# Patient Record
Sex: Female | Born: 1960 | Race: Black or African American | Hispanic: No | Smoking: Former smoker
Health system: Southern US, Community
[De-identification: ages and names within clinical notes are randomized; demographics above are authoritative.]

## PROBLEM LIST (undated history)

## (undated) DIAGNOSIS — E669 Obesity, unspecified: Secondary | ICD-10-CM

## (undated) DIAGNOSIS — E785 Hyperlipidemia, unspecified: Secondary | ICD-10-CM

## (undated) DIAGNOSIS — Z860100 Personal history of colon polyps, unspecified: Secondary | ICD-10-CM

## (undated) DIAGNOSIS — E119 Type 2 diabetes mellitus without complications: Secondary | ICD-10-CM

## (undated) DIAGNOSIS — I10 Essential (primary) hypertension: Secondary | ICD-10-CM

## (undated) DIAGNOSIS — D649 Anemia, unspecified: Secondary | ICD-10-CM

## (undated) DIAGNOSIS — C3490 Malignant neoplasm of unspecified part of unspecified bronchus or lung: Secondary | ICD-10-CM

## (undated) DIAGNOSIS — M1990 Unspecified osteoarthritis, unspecified site: Secondary | ICD-10-CM

## (undated) HISTORY — DX: Personal history of colonic polyps: Z86.010

## (undated) HISTORY — DX: Personal history of colon polyps, unspecified: Z86.0100

## (undated) HISTORY — DX: Obesity, unspecified: E66.9

## (undated) HISTORY — DX: Anemia, unspecified: D64.9

## (undated) HISTORY — DX: Unspecified osteoarthritis, unspecified site: M19.90

## (undated) HISTORY — PX: NO PAST SURGERIES: SHX2092

---

## 2010-12-14 ENCOUNTER — Emergency Department (HOSPITAL_COMMUNITY)
Admission: EM | Admit: 2010-12-14 | Discharge: 2010-12-15 | Disposition: A | Discharge status: Home / Self Care | Payer: Medicaid Other | Attending: Emergency Medicine | Admitting: Emergency Medicine

## 2010-12-14 DIAGNOSIS — R109 Unspecified abdominal pain: Secondary | ICD-10-CM | POA: Insufficient documentation

## 2010-12-14 DIAGNOSIS — I10 Essential (primary) hypertension: Secondary | ICD-10-CM | POA: Insufficient documentation

## 2010-12-15 ENCOUNTER — Emergency Department (HOSPITAL_COMMUNITY): Payer: Medicaid Other

## 2010-12-15 ENCOUNTER — Other Ambulatory Visit (HOSPITAL_COMMUNITY): Payer: Self-pay

## 2010-12-15 ENCOUNTER — Encounter (HOSPITAL_COMMUNITY): Payer: Self-pay

## 2010-12-15 LAB — URINALYSIS, ROUTINE W REFLEX MICROSCOPIC
Glucose, UA: NEGATIVE mg/dL
Ketones, ur: NEGATIVE mg/dL
Leukocytes, UA: NEGATIVE
pH: 6 (ref 5.0–8.0)

## 2010-12-15 LAB — COMPREHENSIVE METABOLIC PANEL
ALT: 16 U/L (ref 0–35)
AST: 22 U/L (ref 0–37)
Albumin: 4 g/dL (ref 3.5–5.2)
Alkaline Phosphatase: 65 U/L (ref 39–117)
Chloride: 97 mEq/L (ref 96–112)
Creatinine, Ser: 0.73 mg/dL (ref 0.50–1.10)
Potassium: 4.2 mEq/L (ref 3.5–5.1)
Sodium: 133 mEq/L — ABNORMAL LOW (ref 135–145)
Total Bilirubin: 0.3 mg/dL (ref 0.3–1.2)

## 2010-12-15 LAB — DIFFERENTIAL
Basophils Absolute: 0 10*3/uL (ref 0.0–0.1)
Basophils Relative: 1 % (ref 0–1)
Eosinophils Absolute: 0.3 10*3/uL (ref 0.0–0.7)
Eosinophils Relative: 5 % (ref 0–5)
Neutrophils Relative %: 45 % (ref 43–77)

## 2010-12-15 LAB — CBC
Platelets: 251 10*3/uL (ref 150–400)
RDW: 15.3 % (ref 11.5–15.5)
WBC: 5.7 10*3/uL (ref 4.0–10.5)

## 2010-12-16 LAB — URINE CULTURE
Colony Count: NO GROWTH
Culture  Setup Time: 201209170952

## 2012-04-22 DIAGNOSIS — M5416 Radiculopathy, lumbar region: Secondary | ICD-10-CM | POA: Insufficient documentation

## 2012-04-22 DIAGNOSIS — M5430 Sciatica, unspecified side: Secondary | ICD-10-CM | POA: Insufficient documentation

## 2012-04-26 DIAGNOSIS — E119 Type 2 diabetes mellitus without complications: Secondary | ICD-10-CM | POA: Insufficient documentation

## 2012-07-07 HISTORY — PX: COLONOSCOPY: SHX174

## 2012-07-07 LAB — HM COLONOSCOPY

## 2013-01-23 DIAGNOSIS — Z720 Tobacco use: Secondary | ICD-10-CM | POA: Insufficient documentation

## 2015-09-04 DIAGNOSIS — N951 Menopausal and female climacteric states: Secondary | ICD-10-CM | POA: Insufficient documentation

## 2015-09-04 DIAGNOSIS — J309 Allergic rhinitis, unspecified: Secondary | ICD-10-CM | POA: Insufficient documentation

## 2015-09-04 DIAGNOSIS — Z6839 Body mass index (BMI) 39.0-39.9, adult: Secondary | ICD-10-CM | POA: Insufficient documentation

## 2015-09-04 DIAGNOSIS — E1159 Type 2 diabetes mellitus with other circulatory complications: Secondary | ICD-10-CM | POA: Insufficient documentation

## 2015-09-04 DIAGNOSIS — I10 Essential (primary) hypertension: Secondary | ICD-10-CM | POA: Insufficient documentation

## 2015-09-04 DIAGNOSIS — I152 Hypertension secondary to endocrine disorders: Secondary | ICD-10-CM | POA: Insufficient documentation

## 2015-09-04 DIAGNOSIS — E785 Hyperlipidemia, unspecified: Secondary | ICD-10-CM | POA: Insufficient documentation

## 2016-09-03 DIAGNOSIS — F17210 Nicotine dependence, cigarettes, uncomplicated: Secondary | ICD-10-CM | POA: Insufficient documentation

## 2018-01-03 ENCOUNTER — Emergency Department
Admission: EM | Admit: 2018-01-03 | Discharge: 2018-01-03 | Disposition: A | Discharge status: Home / Self Care | Payer: Self-pay | Source: Home / Self Care | Attending: Family Medicine | Admitting: Family Medicine

## 2018-01-03 ENCOUNTER — Other Ambulatory Visit: Payer: Self-pay

## 2018-01-03 ENCOUNTER — Encounter: Payer: Self-pay | Admitting: *Deleted

## 2018-01-03 ENCOUNTER — Emergency Department (INDEPENDENT_AMBULATORY_CARE_PROVIDER_SITE_OTHER): Payer: Self-pay

## 2018-01-03 DIAGNOSIS — S46911A Strain of unspecified muscle, fascia and tendon at shoulder and upper arm level, right arm, initial encounter: Secondary | ICD-10-CM

## 2018-01-03 DIAGNOSIS — M25511 Pain in right shoulder: Secondary | ICD-10-CM

## 2018-01-03 HISTORY — DX: Hyperlipidemia, unspecified: E78.5

## 2018-01-03 HISTORY — DX: Essential (primary) hypertension: I10

## 2018-01-03 HISTORY — DX: Type 2 diabetes mellitus without complications: E11.9

## 2018-01-03 MED ORDER — HYDROCODONE-ACETAMINOPHEN 5-325 MG PO TABS: ORAL_TABLET | ORAL | 0 refills | Status: DC
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
## 2018-01-03 NOTE — Discharge Instructions (Addendum)
Apply ice pack for 20 to 30 minutes, 3 to 4 times daily  Continue until pain and swelling decrease.  May take Ibuprofen 200mg, 4 tabs every 8 hours with food.  
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
## 2018-01-04 ENCOUNTER — Encounter: Payer: Self-pay | Admitting: Family Medicine

## 2018-01-04 ENCOUNTER — Telehealth: Payer: Self-pay

## 2018-01-04 ENCOUNTER — Ambulatory Visit (INDEPENDENT_AMBULATORY_CARE_PROVIDER_SITE_OTHER): Payer: Self-pay | Admitting: Family Medicine

## 2018-01-04 VITALS — BP 170/80 | HR 72 | Ht 68.0 in | Wt 259.0 lb

## 2018-01-04 DIAGNOSIS — S46011A Strain of muscle(s) and tendon(s) of the rotator cuff of right shoulder, initial encounter: Secondary | ICD-10-CM

## 2018-01-04 MED ORDER — DICLOFENAC SODIUM 1 % TD GEL: 2.0000 g | Freq: Four times a day (QID) | TRANSDERMAL | 11 refills | Status: DC

## 2018-01-04 NOTE — Patient Instructions (Addendum)
Thank you for coming in today. Do the range of motion exercises.  Start the band exercises when able.  Attend PT if not improving.  We can do a shot if needed.  Apply the diclofenac gel up to 4x daily for pain as needed.   Recheck in 4-6 weeks.   Return sooner if needed.    Shoulder Impingement Syndrome Rehab Ask your health care provider which exercises are safe for you. Do exercises exactly as told by your health care provider and adjust them as directed. It is normal to feel mild stretching, pulling, tightness, or discomfort as you do these exercises, but you should stop right away if you feel sudden pain or your pain gets worse.Do not begin these exercises until told by your health care provider. Stretching and range of motion exercise This exercise warms up your muscles and joints and improves the movement and flexibility of your shoulder. This exercise also helps to relieve pain and stiffness. Exercise A: Passive horizontal adduction  1. Sit or stand and pull your left / right elbow across your chest, toward your other shoulder. Stop when you feel a gentle stretch in the back of your shoulder and upper arm. ? Keep your arm at shoulder height. ? Keep your arm as close to your body as you comfortably can. 2. Hold for __________ seconds. 3. Slowly return to the starting position. Repeat __________ times. Complete this exercise __________ times a day. Strengthening exercises These exercises build strength and endurance in your shoulder. Endurance is the ability to use your muscles for a long time, even after they get tired. Exercise B: External rotation, isometric 1. Stand or sit in a doorway, facing the door frame. 2. Bend your left / right elbow and place the back of your wrist against the door frame. Only your wrist should be touching the frame. Keep your upper arm at your side. 3. Gently press your wrist against the door frame, as if you are trying to push your arm away from your  abdomen. ? Avoid shrugging your shoulder while you press your hand against the door frame. Keep your shoulder blade tucked down toward the middle of your back. 4. Hold for __________ seconds. 5. Slowly release the tension, and relax your muscles completely before you do the exercise again. Repeat __________ times. Complete this exercise __________ times a day. Exercise C: Internal rotation, isometric  1. Stand or sit in a doorway, facing the door frame. 2. Bend your left / right elbow and place the inside of your wrist against the door frame. Only your wrist should be touching the frame. Keep your upper arm at your side. 3. Gently press your wrist against the door frame, as if you are trying to push your arm toward your abdomen. ? Avoid shrugging your shoulder while you press your hand against the door frame. Keep your shoulder blade tucked down toward the middle of your back. 4. Hold for __________ seconds. 5. Slowly release the tension, and relax your muscles completely before you do the exercise again. Repeat __________ times. Complete this exercise __________ times a day. Exercise D: Scapular protraction, supine  1. Lie on your back on a firm surface. Hold a __________ weight in your left / right hand. 2. Raise your left / right arm straight into the air so your hand is directly above your shoulder joint. 3. Push the weight into the air so your shoulder lifts off of the surface that you are lying on. Do not  move your head, neck, or back. 4. Hold for __________ seconds. 5. Slowly return to the starting position. Let your muscles relax completely before you repeat this exercise. Repeat __________ times. Complete this exercise __________ times a day. Exercise E: Scapular retraction  1. Sit in a stable chair without armrests, or stand. 2. Secure an exercise band to a stable object in front of you so the band is at shoulder height. 3. Hold one end of the exercise band in each hand. Your  palms should face down. 4. Squeeze your shoulder blades together and move your elbows slightly behind you. Do not shrug your shoulders while you do this. 5. Hold for __________ seconds. 6. Slowly return to the starting position. Repeat __________ times. Complete this exercise __________ times a day. Exercise F: Shoulder extension  1. Sit in a stable chair without armrests, or stand. 2. Secure an exercise band to a stable object in front of you where the band is above shoulder height. 3. Hold one end of the exercise band in each hand. 4. Straighten your elbows and lift your hands up to shoulder height. 5. Squeeze your shoulder blades together and pull your hands down to the sides of your thighs. Stop when your hands are straight down by your sides. Do not let your hands go behind your body. 6. Hold for __________ seconds. 7. Slowly return to the starting position. Repeat __________ times. Complete this exercise __________ times a day. This information is not intended to replace advice given to you by your health care provider. Make sure you discuss any questions you have with your health care provider. Document Released: 03/16/2005 Document Revised: 11/21/2015 Document Reviewed: 02/16/2015 Elsevier Interactive Patient Education  [employer].

## 2018-01-04 NOTE — Telephone Encounter (Signed)
Patient called stated that she needs a release letter saying that she can go back to work. Patient stated that she has plans to go back tonight. Patient wants letter faxed to [facility]. Benjamine Mola @ [phone]. Rhonda Cunningham,CMA
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
## 2018-01-04 NOTE — Telephone Encounter (Signed)
Letter has been faxed and a message was left on patient vm advising of this. Gerald Kuehl,CMA

## 2018-01-04 NOTE — Telephone Encounter (Signed)
Letter written and will be faxed today.

## 2018-02-02 ENCOUNTER — Encounter: Payer: Self-pay | Admitting: Physician Assistant

## 2018-02-02 ENCOUNTER — Ambulatory Visit (INDEPENDENT_AMBULATORY_CARE_PROVIDER_SITE_OTHER): Payer: Self-pay | Admitting: Physician Assistant

## 2018-02-02 VITALS — BP 124/78 | HR 70 | Resp 14 | Wt 258.0 lb

## 2018-02-02 DIAGNOSIS — Z860100 Personal history of colon polyps, unspecified: Secondary | ICD-10-CM

## 2018-02-02 DIAGNOSIS — Z780 Asymptomatic menopausal state: Secondary | ICD-10-CM | POA: Insufficient documentation

## 2018-02-02 DIAGNOSIS — Z1231 Encounter for screening mammogram for malignant neoplasm of breast: Secondary | ICD-10-CM

## 2018-02-02 DIAGNOSIS — E1159 Type 2 diabetes mellitus with other circulatory complications: Secondary | ICD-10-CM

## 2018-02-02 DIAGNOSIS — F17210 Nicotine dependence, cigarettes, uncomplicated: Secondary | ICD-10-CM

## 2018-02-02 DIAGNOSIS — Z7689 Persons encountering health services in other specified circumstances: Secondary | ICD-10-CM

## 2018-02-02 DIAGNOSIS — E119 Type 2 diabetes mellitus without complications: Secondary | ICD-10-CM

## 2018-02-02 DIAGNOSIS — N907 Vulvar cyst: Secondary | ICD-10-CM

## 2018-02-02 DIAGNOSIS — I10 Essential (primary) hypertension: Secondary | ICD-10-CM

## 2018-02-02 LAB — POCT GLYCOSYLATED HEMOGLOBIN (HGB A1C): HbA1c, POC (prediabetic range): 6.7 % — AB (ref 5.7–6.4)

## 2018-02-02 MED ORDER — GLIPIZIDE ER 10 MG PO TB24: 10.0000 mg | ORAL_TABLET | Freq: Every day | ORAL | Status: DC

## 2018-02-02 MED ORDER — NICOTINE 14 MG/24HR TD PT24: 14.0000 mg | MEDICATED_PATCH | Freq: Every day | TRANSDERMAL | 5 refills | Status: DC

## 2018-02-02 MED ORDER — AMLODIPINE BESYLATE 10 MG PO TABS: 10.0000 mg | ORAL_TABLET | ORAL | Status: DC

## 2018-02-02 MED ORDER — METFORMIN HCL ER 500 MG PO TB24: 1000.0000 mg | ORAL_TABLET | Freq: Every day | ORAL | Status: DC

## 2018-02-02 MED ORDER — LISINOPRIL-HYDROCHLOROTHIAZIDE 20-25 MG PO TABS: 1.0000 | ORAL_TABLET | ORAL | Status: DC

## 2018-02-02 NOTE — Patient Instructions (Addendum)
Diabetes Preventive Care: - annual foot exam  - annual dilated eye exam with an eye doctor - self foot exams at least weekly - pneumonia vaccine once (booster in 5 years and at age 57) - annual influenza vaccine - twice yearly dental cleanings and yearly exam - goal blood pressure <140/90, ideally <130/80 - LDL cholesterol <70 - A1C <7.0 - body mass index (BMI) <25.0 - follow-up every 3 months if your A1C is not at goal - follow-up every 6 months if diabetes is well controlled   Coping with Quitting Smoking Quitting smoking is a physical and mental challenge. You will face cravings, withdrawal symptoms, and temptation. Before quitting, work with your health care provider to make a plan that can help you cope. Preparation can help you quit and keep you from giving in. How can I cope with cravings? Cravings usually last for 5-10 minutes. If you get through it, the craving will pass. Consider taking the following actions to help you cope with cravings:  Keep your mouth busy: ? Chew sugar-free gum. ? Suck on hard candies or a straw. ? Brush your teeth.  Keep your hands and body busy: ? Immediately change to a different activity when you feel a craving. ? Squeeze or play with a ball. ? Do an activity or a hobby, like making bead jewelry, practicing needlepoint, or working with wood. ? Mix up your normal routine. ? Take a short exercise break. Go for a quick walk or run up and down stairs. ? Spend time in public places where smoking is not allowed.  Focus on doing something kind or helpful for someone else.  Call a friend or family member to talk during a craving.  Join a support group.  Call a quit line, such as 1-800-QUIT-NOW.  Talk with your health care provider about medicines that might help you cope with cravings and make quitting easier for you.  How can I deal with withdrawal symptoms? Your body may experience negative effects as it tries to get used to not having  nicotine in the system. These effects are called withdrawal symptoms. They may include:  Feeling hungrier than normal.  Trouble concentrating.  Irritability.  Trouble sleeping.  Feeling depressed.  Restlessness and agitation.  Craving a cigarette.  To manage withdrawal symptoms:  Avoid places, people, and activities that trigger your cravings.  Remember why you want to quit.  Get plenty of sleep.  Avoid coffee and other caffeinated drinks. These may worsen some of your symptoms.  How can I handle social situations? Social situations can be difficult when you are quitting smoking, especially in the first few weeks. To manage this, you can:  Avoid parties, bars, and other social situations where people might be smoking.  Avoid alcohol.  Leave right away if you have the urge to smoke.  Explain to your family and friends that you are quitting smoking. Ask for understanding and support.  Plan activities with friends or family where smoking is not an option.  What are some ways I can cope with stress? Wanting to smoke may cause stress, and stress can make you want to smoke. Find ways to manage your stress. Relaxation techniques can help. For example:  Breathe slowly and deeply, in through your nose and out through your mouth.  Listen to soothing, relaxing music.  Talk with a family member or friend about your stress.  Light a candle.  Soak in a bath or take a shower.  Think about a peaceful  place.  What are some ways I can prevent weight gain? Be aware that many people gain weight after they quit smoking. However, not everyone does. To keep from gaining weight, have a plan in place before you quit and stick to the plan after you quit. Your plan should include:  Having healthy snacks. When you have a craving, it may help to: ? Eat plain popcorn, crunchy carrots, celery, or other cut vegetables. ? Chew sugar-free gum.  Changing how you eat: ? Eat small portion  sizes at meals. ? Eat 4-6 small meals throughout the day instead of 1-2 large meals a day. ? Be mindful when you eat. Do not watch television or do other things that might distract you as you eat.  Exercising regularly: ? Make time to exercise each day. If you do not have time for a long workout, do short bouts of exercise for 5-10 minutes several times a day. ? Do some form of strengthening exercise, like weight lifting, and some form of aerobic exercise, like running or swimming.  Drinking plenty of water or other low-calorie or no-calorie drinks. Drink 6-8 glasses of water daily, or as much as instructed by your health care provider.  Summary  Quitting smoking is a physical and mental challenge. You will face cravings, withdrawal symptoms, and temptation to smoke again. Preparation can help you as you go through these challenges.  You can cope with cravings by keeping your mouth busy (such as by chewing gum), keeping your body and hands busy, and making calls to family, friends, or a helpline for people who want to quit smoking.  You can cope with withdrawal symptoms by avoiding places where people smoke, avoiding drinks with caffeine, and getting plenty of rest.  Ask your health care provider about the different ways to prevent weight gain, avoid stress, and handle social situations. This information is not intended to replace advice given to you by your health care provider. Make sure you discuss any questions you have with your health care provider. Document Released: 03/13/2016 Document Revised: 03/13/2016 Document Reviewed: 03/13/2016 Elsevier Interactive Patient Education  [employer].

## 2018-02-02 NOTE — Progress Notes (Signed)
HPI:                                                                Morgan Garza is a 57 y.o. female who presents to [facility] today to establish care  Current concerns:  DMII: taking Metformin XR and Glipizide XL 10 mg daily. Reports she is not always compliant with medications.  Denies polydipsia, polyuria, polyphagia. Denies blurred vision or vision change. Denies extremity pain, altered sensation and paresthesias.  Denies ulcers/wounds on feet.  HTN: taking Prinzide 20-25 mg daily and Amlodipine 10 mg. Compliant with medications. Does not hecks BP's at home. Denies chest pain with exertion, orthopnea, lightheadedness, syncope and edema. Risk factors include: DM2, obesity, tobacco use,   LMP was over 1 year ago and prior to that only had 1 period in 2017. States she has been abstinent since 2008. Reports very remote history of chlamydia at age 37. She states she has a romantic interest who lives in [place] and she wants to make sure she can't get pregnant. Reports "skin tags" on her vulva that have been present for approx 6 months. They are not symptomatic, but she just wants to make sure it is okay to be sexually active again.  She is also interested in smoking cessation. She reports Wellbutrin was effective for her in past quit attempts, but unfortunately she developed a rash with later use. She also had intolerance to Chantix.  Depression screen PHQ 2/9 02/02/2018  Decreased Interest 0  Down, Depressed, Hopeless 0  PHQ - 2 Score 0  Altered sleeping 0  Tired, decreased energy 3  Change in appetite 3  Feeling bad or failure about yourself  0  Trouble concentrating 0  Moving slowly or fidgety/restless 3  Suicidal thoughts 0  PHQ-9 Score 9    GAD 7 : Generalized Anxiety Score 02/02/2018  Nervous, Anxious, on Edge 3  Control/stop worrying 3  Worry too much - different things 3  Trouble relaxing 3  Restless 3  Easily annoyed  or irritable 0  Afraid - awful might happen 0  Total GAD 7 Score 15      Past Medical History:  Diagnosis Date  . Diabetes mellitus without complication ([facility])   . History of colon polyps   . Hyperlipidemia   . Hypertension    Past Surgical History:  Procedure Laterality Date  . NO PAST SURGERIES     Social History   Tobacco Use  . Smoking status: Current Every Day Smoker    Packs/day: 0.50    Years: 1.00    Pack years: 0.50    Types: Cigarettes  . Smokeless tobacco: Never Used  Substance Use Topics  . Alcohol use: Not Currently    Frequency: Never   family history includes COPD in her mother; Cancer in her father; Gastric cancer in her father; Hypertension in her mother; Testicular cancer in her son.    ROS: negative except as noted in the HPI  Medications: Current Outpatient Medications  Medication Sig Dispense Refill  . amLODipine (NORVASC) 10 MG tablet Take 1 tablet (10 mg total) by mouth every morning.    Marland Kitchen atorvastatin (LIPITOR) 20 MG tablet Take by mouth.    . Cholecalciferol (VITAMIN D3) 5000  units TABS Take by mouth.    Marland Kitchen glipiZIDE (GLUCOTROL XL) 10 MG 24 hr tablet Take 1 tablet (10 mg total) by mouth daily before breakfast.    . glucose blood (PRECISION QID TEST) test strip Use one strip to test blood sugar.    . HYDROcodone-acetaminophen (NORCO/VICODIN) 5-325 MG tablet Take 1 tablet by mouth every 8 (eight) hours as needed for moderate pain. 15 tablet 0  . ibuprofen (ADVIL,MOTRIN) 800 MG tablet Take by mouth.    Marland Kitchen lisinopril-hydrochlorothiazide (PRINZIDE,ZESTORETIC) 20-25 MG tablet Take 1 tablet by mouth every morning.    . metFORMIN (GLUCOPHAGE-XR) 500 MG 24 hr tablet Take 2 tablets (1,000 mg total) by mouth daily with supper.    . nicotine (NICODERM CQ) 14 mg/24hr patch Place 1 patch (14 mg total) onto the skin daily. 28 patch 5   No current facility-administered medications for this visit.    Allergies  Allergen Reactions  . Bupropion Hives        . Varenicline Other (See Comments)    Vision changes, "everything was enhanced"       Objective:  BP 124/78   Pulse 70   Resp 14   Wt 258 lb (117 kg)   LMP 11/09/2010   BMI 39.23 kg/m  Gen:  alert, not ill-appearing, no distress, appropriate for age, obese female HEENT: head normocephalic without obvious abnormality, conjunctiva and cornea clear, trachea midline Pulm: Normal work of breathing, normal phonation, clear to auscultation bilaterally, no wheezes, rales or rhonchi CV: Normal rate, regular rhythm, s1 and s2 distinct, no murmurs, clicks or rubs  Neuro: alert and oriented x 3, no tremor MSK: extremities atraumatic, normal gait and station Skin: intact, proximal left upper extremity there is a large approx 3-4 cm pedunculated skin tag GU: bilateral labia majora there are several small approx 5 mm smooth, cystic nodules without tenderness or drainage Psych: well-groomed, cooperative, good eye contact, euthymic mood, affect mood-congruent, speech is articulate, and thought processes clear and goal-directed    A chaperone was used for the GU portion of the exam, Emeterio Reeve, DO  Lab Results  Component Value Date   HGBA1C 6.7 (A) 02/02/2018   Lab Results  Component Value Date   CREATININE 0.73 12/15/2010   BUN 12 12/15/2010   NA 133 (L) 12/15/2010   K 4.2 12/15/2010   CL 97 12/15/2010   CO2 26 12/15/2010     No results found for this or any previous visit (from the past 72 hour(s)). No results found.    Assessment and Plan: 57 y.o. female with   .Morgan Garza was seen today for establish care.  Diagnoses and all orders for this visit:  Encounter to establish care  Type 2 diabetes mellitus without complication, without long-term current use of insulin (HCC) -     COMPLETE METABOLIC PANEL WITH GFR -     metFORMIN (GLUCOPHAGE-XR) 500 MG 24 hr tablet; Take 2 tablets (1,000 mg total) by mouth daily with supper. -     glipiZIDE (GLUCOTROL XL) 10 MG 24 hr  tablet; Take 1 tablet (10 mg total) by mouth daily before breakfast. -     POCT HgB A1C  Hypertension associated with diabetes (HCC) -     lisinopril-hydrochlorothiazide (PRINZIDE,ZESTORETIC) 20-25 MG tablet; Take 1 tablet by mouth every morning. -     amLODipine (NORVASC) 10 MG tablet; Take 1 tablet (10 mg total) by mouth every morning.  Epidermoid cyst of labia majora  History of colon polyps  Postmenopausal  Cigarette nicotine dependence without complication -     nicotine (NICODERM CQ) 14 mg/24hr patch; Place 1 patch (14 mg total) onto the skin daily.  Breast cancer screening by mammogram -     MM 3D SCREEN BREAST BILATERAL; Future     - Personally reviewed PMH, PSH, PFH, medications, allergies, HM - Age-appropriate cancer screening: mammogram ordered, overdue for Pap smear, overdue for colonoscopy; she is self-pay and cannot currently afford all of her preventive measures - Immunizations reviewed and UTD - PHQ2 negative  Diabetes   A1c at goal. Since compliance has been an issue, will reduce her Metformin to 1000 mg with evening meal. Cont Glipizide XL 10 mg daily. She was amenable to these changes She is overdue for routine labs. She is self-pay so will order BMP only for now to monitor renal function LDL goal <70, cont statin BP goal <140/90, cont ACE-thiazide Counseled on diabetes preventive care  Labial cysts Reassurance of benign nature of these skin lesions If she desires removal, can refer to Gynecology She declines referral at this time  HTN BP at goal She will need to complete renal function for refills of her antihypertensive medication. She expressed understanding  Nicotine dependence The patient was counseled on the dangers of tobacco use, and was advised to quit.  Reviewed strategies to maximize success, including removing cigarettes and smoking materials from environment, stress management, support of family/friends, written materials and pharmacotherapy  (Nicoderm CQ). Plan to use Nicotine replacement. Start with 14 mg patch for 4-6 weeks, then reduce to 7 mg patch for 2-4 weeks   Patient education and anticipatory guidance given Patient agrees with treatment plan Follow-up in 3 months for medication management or sooner as needed if symptoms worsen or fail to improve  Darlyne Russian PA-C

## 2018-02-04 ENCOUNTER — Ambulatory Visit (INDEPENDENT_AMBULATORY_CARE_PROVIDER_SITE_OTHER): Payer: Self-pay | Admitting: Sports Medicine

## 2018-02-04 ENCOUNTER — Encounter: Payer: Self-pay | Admitting: Sports Medicine

## 2018-02-04 DIAGNOSIS — R2232 Localized swelling, mass and lump, left upper limb: Secondary | ICD-10-CM | POA: Insufficient documentation

## 2018-02-04 MED ORDER — HYDROCODONE-ACETAMINOPHEN 5-325 MG PO TABS: 1.0000 | ORAL_TABLET | Freq: Three times a day (TID) | ORAL | 0 refills | Status: DC | PRN

## 2018-02-04 NOTE — Patient Instructions (Signed)
Incision Care, Adult An incision is a surgical cut that is made through your skin. Most incisions are closed after surgery. Your incision may be closed with stitches (sutures), Sibrian, skin glue, or adhesive strips. You may need to return to your health care provider to have sutures or Mazurowski removed. This may occur several days to several weeks after your surgery. The incision needs to be cared for properly to prevent infection. How to care for your incision Incision care   Follow instructions from your health care provider about how to take care of your incision. Make sure you: ? Wash your hands with soap and water before you change the bandage (dressing). If soap and water are not available, use hand sanitizer. ? Change your dressing as told by your health care provider. ? Leave sutures, skin glue, or adhesive strips in place. These skin closures may need to stay in place for 2 weeks or longer. If adhesive strip edges start to loosen and curl up, you may trim the loose edges. Do not remove adhesive strips completely unless your health care provider tells you to do that.  Check your incision area every day for signs of infection. Check for: ? More redness, swelling, or pain. ? More fluid or blood. ? Warmth. ? Pus or a bad smell.  Ask your health care provider how to clean the incision. This may include: ? Using mild soap and water. ? Using a clean towel to pat the incision dry after cleaning it. ? Applying a cream or ointment. Do this only as told by your health care provider. ? Covering the incision with a clean dressing.  Ask your health care provider when you can leave the incision uncovered.  Do not take baths, swim, or use a hot tub until your health care provider approves. Ask your health care provider if you can take showers. You may only be allowed to take sponge baths for bathing. Medicines  If you were prescribed an antibiotic medicine, cream, or ointment, take or apply the  antibiotic as told by your health care provider. Do not stop taking or applying the antibiotic even if your condition improves.  Take over-the-counter and prescription medicines only as told by your health care provider. General instructions  Limit movement around your incision to improve healing. ? Avoid straining, lifting, or exercise for the first month, or for as long as told by your health care provider. ? Follow instructions from your health care provider about returning to your normal activities. ? Ask your health care provider what activities are safe.  Protect your incision from the sun when you are outside for the first 6 months, or for as long as told by your health care provider. Apply sunscreen around the scar or cover it up.  Keep all follow-up visits as told by your health care provider. This is important. Contact a health care provider if:  Your have more redness, swelling, or pain around the incision.  You have more fluid or blood coming from the incision.  Your incision feels warm to the touch.  You have pus or a bad smell coming from the incision.  You have a fever or shaking chills.  You are nauseous or you vomit.  You are dizzy.  Your sutures or Pichardo come undone. Get help right away if:  You have a red streak coming from your incision.  Your incision bleeds through the dressing and the bleeding does not stop with gentle pressure.  The edges of   your incision open up and separate.  You have severe pain.  You have a rash.  You are confused.  You faint.  You have trouble breathing and a fast heartbeat. This information is not intended to replace advice given to you by your health care provider. Make sure you discuss any questions you have with your health care provider. Document Released: 10/03/2004 Document Revised: 11/22/2015 Document Reviewed: 10/02/2015 Elsevier Interactive Patient Education  2018 Elsevier Inc.  

## 2018-02-04 NOTE — Assessment & Plan Note (Signed)
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 Greater than 3 cm. Absorbable sutures placed, hydrocodone for postoperative pain, she does have a trip coming up to [place]. Return to see me after her trip.

## 2018-02-04 NOTE — Progress Notes (Signed)
Subjective:    CC: Axillary mass  HPI: For over 10 years this pleasant 57 year old female has had a slowly growing mass in her left axilla, soft.  She denies any paresthesias into her hand.  Symptoms are moderate, worsening.  I reviewed the past medical history, family history, social history, surgical history, and allergies today and no changes were needed.  Please see the problem list section below in epic for further details.  Past Medical History: Past Medical History:  Diagnosis Date  . Diabetes mellitus without complication ([facility])   . History of colon polyps   . Hyperlipidemia   . Hypertension    Past Surgical History: Past Surgical History:  Procedure Laterality Date  . NO PAST SURGERIES     Social History: Social History   Socioeconomic History  . Marital status: Single    Spouse name: Not on file  . Number of children: Not on file  . Years of education: Not on file  . Highest education level: Not on file  Occupational History  . Not on file  Social Needs  . Financial resource strain: Not on file  . Food insecurity:    Worry: Not on file    Inability: Not on file  . Transportation needs:    Medical: Not on file    Non-medical: Not on file  Tobacco Use  . Smoking status: Current Every Day Smoker    Packs/day: 0.50    Years: 1.00    Pack years: 0.50    Types: Cigarettes  . Smokeless tobacco: Never Used  Substance and Sexual Activity  . Alcohol use: Not Currently    Frequency: Never  . Drug use: Never  . Sexual activity: Not Currently    Birth control/protection: Abstinence  Lifestyle  . Physical activity:    Days per week: Not on file    Minutes per session: Not on file  . Stress: Not on file  Relationships  . Social connections:    Talks on phone: Not on file    Gets together: Not on file    Attends religious service: Not on file    Active member of club or organization: Not on file    Attends meetings of clubs or organizations: Not on file   Relationship status: Not on file  Other Topics Concern  . Not on file  Social History Narrative  . Not on file   Family History: Family History  Problem Relation Age of Onset  . Hypertension Mother   . COPD Mother   . Cancer Father   . Gastric cancer Father   . Testicular cancer Son    Allergies: Allergies  Allergen Reactions  . Bupropion Hives       . Varenicline Other (See Comments)    Vision changes, "everything was enhanced"   Medications: See med rec.  Review of Systems: No fevers, chills, night sweats, weight loss, chest pain, or shortness of breath.   Objective:    General: Well Developed, well nourished, and in no acute distress.  Neuro: Alert and oriented x3, extra-ocular muscles intact, sensation grossly intact.  HEENT: Normocephalic, atraumatic, pupils equal round reactive to light, neck supple, no masses, no lymphadenopathy, thyroid nonpalpable.  Skin: Warm and dry, no rashes. Cardiac: Regular rate and rhythm, no murmurs rubs or gallops, no lower extremity edema.  Respiratory: Clear to auscultation bilaterally. Not using accessory muscles, speaking in full sentences. Left axilla: There is a pedunculated lesion that appears to be a skin tag but I  can feel a subcutaneous component as well.  Feels to be approximately 4 cm across.  Procedure: Excision of left axillary 4 cm subcutaneous mass Risks, benefits, and alternatives explained and consent obtained. Time out conducted. Surface prepped with alcohol. 10cc lidocaine with epinephine infiltrated in a field block. Adequate anesthesia ensured. Area prepped and draped in a sterile fashion. Excision performed with: I made an elliptical incision around the mass, then using both sharp and blunt dissection I was able to remove it in about 3 pieces, the largest of which was approximately 4 cm across.  Classic lipoma.  I then closed the incision with a running subcuticular 4-0 simple interrupted Vicryl suture. Hemostasis  achieved. Pt stable.  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 Greater than 3 cm. Absorbable sutures placed, hydrocodone for postoperative pain, she does have a trip coming up to [place]. Return to see me after her trip. ___________________________________________ Gwen Her. Dianah Field, M.D., ABFM., CAQSM. Primary Care and Sports Medicine [facility]  Adjunct Professor of [employer]

## 2018-02-06 ENCOUNTER — Encounter: Payer: Self-pay | Admitting: Physician Assistant

## 2018-02-06 DIAGNOSIS — N907 Vulvar cyst: Secondary | ICD-10-CM | POA: Insufficient documentation

## 2018-02-06 DIAGNOSIS — F17210 Nicotine dependence, cigarettes, uncomplicated: Secondary | ICD-10-CM | POA: Insufficient documentation

## 2018-02-23 ENCOUNTER — Ambulatory Visit (INDEPENDENT_AMBULATORY_CARE_PROVIDER_SITE_OTHER): Payer: Self-pay

## 2018-02-23 DIAGNOSIS — Z1231 Encounter for screening mammogram for malignant neoplasm of breast: Secondary | ICD-10-CM

## 2018-02-24 LAB — COMPLETE METABOLIC PANEL WITH GFR
AG RATIO: 1.7 (calc) (ref 1.0–2.5)
ALT: 17 U/L (ref 6–29)
AST: 18 U/L (ref 10–35)
Albumin: 4.1 g/dL (ref 3.6–5.1)
Alkaline phosphatase (APISO): 65 U/L (ref 33–130)
BILIRUBIN TOTAL: 0.4 mg/dL (ref 0.2–1.2)
BUN: 16 mg/dL (ref 7–25)
CO2: 26 mmol/L (ref 20–32)
Calcium: 9.1 mg/dL (ref 8.6–10.4)
Chloride: 106 mmol/L (ref 98–110)
Creat: 0.85 mg/dL (ref 0.50–1.05)
GFR, Est African American: 88 mL/min/{1.73_m2} (ref >=60)
GFR, Est Non African American: 76 mL/min/{1.73_m2} (ref >=60)
Globulin: 2.4 g/dL (calc) (ref 1.9–3.7)
Glucose, Bld: 169 mg/dL — ABNORMAL HIGH (ref 65–99)
Potassium: 3.8 mmol/L (ref 3.5–5.3)
Sodium: 141 mmol/L (ref 135–146)
Total Protein: 6.5 g/dL (ref 6.1–8.1)

## 2018-02-27 ENCOUNTER — Other Ambulatory Visit: Payer: Self-pay | Admitting: Physician Assistant

## 2018-02-27 DIAGNOSIS — E119 Type 2 diabetes mellitus without complications: Secondary | ICD-10-CM

## 2018-02-27 DIAGNOSIS — E1159 Type 2 diabetes mellitus with other circulatory complications: Secondary | ICD-10-CM

## 2018-02-27 DIAGNOSIS — I10 Essential (primary) hypertension: Principal | ICD-10-CM

## 2018-02-27 MED ORDER — ATORVASTATIN CALCIUM 20 MG PO TABS: 20.0000 mg | ORAL_TABLET | Freq: Every day | ORAL | 1 refills | Status: DC

## 2018-02-27 MED ORDER — AMLODIPINE BESYLATE 10 MG PO TABS: 10.0000 mg | ORAL_TABLET | ORAL | 1 refills | Status: DC

## 2018-02-27 MED ORDER — LISINOPRIL-HYDROCHLOROTHIAZIDE 20-25 MG PO TABS: 1.0000 | ORAL_TABLET | ORAL | 1 refills | Status: DC

## 2018-02-27 MED ORDER — GLIPIZIDE ER 10 MG PO TB24: 10.0000 mg | ORAL_TABLET | Freq: Every day | ORAL | 1 refills | Status: DC

## 2018-02-27 MED ORDER — METFORMIN HCL ER 500 MG PO TB24: 1000.0000 mg | ORAL_TABLET | Freq: Every day | ORAL | 1 refills | Status: DC

## 2018-03-17 ENCOUNTER — Encounter: Payer: Self-pay | Admitting: Physician Assistant

## 2018-04-01 ENCOUNTER — Ambulatory Visit (INDEPENDENT_AMBULATORY_CARE_PROVIDER_SITE_OTHER): Payer: Self-pay | Admitting: Physician Assistant

## 2018-04-01 ENCOUNTER — Encounter: Payer: Self-pay | Admitting: Physician Assistant

## 2018-04-01 ENCOUNTER — Ambulatory Visit (INDEPENDENT_AMBULATORY_CARE_PROVIDER_SITE_OTHER): Payer: Self-pay

## 2018-04-01 ENCOUNTER — Ambulatory Visit (HOSPITAL_BASED_OUTPATIENT_CLINIC_OR_DEPARTMENT_OTHER)
Admission: RE | Admit: 2018-04-01 | Discharge: 2018-04-01 | Disposition: A | Discharge status: Home / Self Care | Payer: Self-pay | Source: Ambulatory Visit | Attending: Physician Assistant | Admitting: Physician Assistant

## 2018-04-01 VITALS — BP 160/78 | HR 67 | Temp 98.2°F | Wt 258.7 lb

## 2018-04-01 DIAGNOSIS — M542 Cervicalgia: Secondary | ICD-10-CM

## 2018-04-01 DIAGNOSIS — G44319 Acute post-traumatic headache, not intractable: Secondary | ICD-10-CM

## 2018-04-01 DIAGNOSIS — W108XXA Fall (on) (from) other stairs and steps, initial encounter: Secondary | ICD-10-CM

## 2018-04-01 DIAGNOSIS — S060X0A Concussion without loss of consciousness, initial encounter: Secondary | ICD-10-CM

## 2018-04-01 NOTE — Progress Notes (Signed)
Called patient and discussed results  Neck x-ray shows degenerative changes and arthritis, no fracture Recommend Naproxen 2 tabs every 12 hours as needed for pain

## 2018-04-01 NOTE — Progress Notes (Signed)
HPI:                                                                Morgan Garza is a 58 y.o. female who presents to Morgan Garza: Morgan Garza today for headache  Reports fall at home approx 2 weeks ago. She slipped on a wet ramp entering a tool shed and her head struck the wood floor of the shed. The fall was so forceful it knocked her shoes off of her feet. She doesn't think there was LOC. She remembers falling. She developed posterior headache several hours after the fall and has had a daily headache in the same location where she struck her head associated w/blurred vision for the past 2 weeks.   Depression screen [facility] 2/9 04/01/2018 02/02/2018  Decreased Interest 0 0  Down, Depressed, Hopeless 0 0  PHQ - 2 Score 0 0  Altered sleeping 0 0  Tired, decreased energy 1 3  Change in appetite 3 3  Feeling bad or failure about yourself  0 0  Trouble concentrating 0 0  Moving slowly or fidgety/restless 0 3  Suicidal thoughts 0 0  PHQ-9 Score 4 9    GAD 7 : Generalized Anxiety Score 04/01/2018 02/02/2018  Nervous, Anxious, on Edge 0 3  Control/stop worrying 0 3  Worry too much - different things 3 3  Trouble relaxing 3 3  Restless 1 3  Easily annoyed or irritable 0 0  Afraid - awful might happen 1 0  Total GAD 7 Score 8 15  Anxiety Difficulty Somewhat difficult -      Past Medical History:  Diagnosis Date  . Diabetes mellitus without complication ([place])   . History of colon polyps   . Hyperlipidemia   . Hypertension    Past Surgical History:  Procedure Laterality Date  . NO PAST SURGERIES     Social History   Tobacco Use  . Smoking status: Former Smoker    Packs/day: 0.50    Years: 1.00    Pack years: 0.50    Types: Cigarettes    Last attempt to quit: 03/30/2018  . Smokeless tobacco: Never Used  Substance Use Topics  . Alcohol use: Not Currently    Frequency: Never   family history includes COPD in her mother; Cancer in her  father; Gastric cancer in her father; Hypertension in her mother; Testicular cancer in her son.    ROS: negative except as noted in the HPI  Medications: Current Outpatient Medications  Medication Sig Dispense Refill  . amLODipine (NORVASC) 10 MG tablet Take 1 tablet (10 mg total) by mouth every morning. 90 tablet 1  . atorvastatin (LIPITOR) 20 MG tablet Take 1 tablet (20 mg total) by mouth at bedtime. 90 tablet 1  . Cholecalciferol (VITAMIN D3) 5000 units TABS Take by mouth.    Marland Kitchen glipiZIDE (GLUCOTROL XL) 10 MG 24 hr tablet Take 1 tablet (10 mg total) by mouth daily before breakfast. 90 tablet 1  . glucose blood (PRECISION QID TEST) test strip Use one strip to test blood sugar.    . HYDROcodone-acetaminophen (NORCO/VICODIN) 5-325 MG tablet Take 1 tablet by mouth every 8 (eight) hours as needed for moderate pain. 15 tablet 0  . ibuprofen (ADVIL,MOTRIN) 800 MG  tablet Take by mouth.    Marland Kitchen lisinopril-hydrochlorothiazide (PRINZIDE,ZESTORETIC) 20-25 MG tablet Take 1 tablet by mouth every morning. 90 tablet 1  . metFORMIN (GLUCOPHAGE-XR) 500 MG 24 hr tablet Take 2 tablets (1,000 mg total) by mouth daily with supper. 180 tablet 1  . nicotine (NICODERM CQ) 14 mg/24hr patch Place 1 patch (14 mg total) onto the skin daily. 28 patch 5   No current facility-administered medications for this visit.    Allergies  Allergen Reactions  . Bupropion Hives       . Varenicline Other (See Comments)    Vision changes, "everything was enhanced"       Objective:  BP (!) 160/78 (BP Location: Right Arm, Patient Position: Sitting, Cuff Size: Large)   Pulse 67   Temp 98.2 F (36.8 C) (Oral)   Wt 258 lb 11.2 oz (117.3 kg)   LMP 11/09/2010   BMI 39.34 kg/m  Gen: well-groomed, not ill-appearing, no acute distress HEENT: head normocephalic, atraumatic; conjunctiva and cornea clear, oropharynx clear, moist mucus membranes; neck supple, no meningeal signs Pulm: Normal work of breathing, normal phonation,  clear to auscultation bilaterally CV: Normal rate, regular rhythm, s1 and s2 distinct, no murmurs, clicks or rubs Neuro:  cranial nerves II-XII intact, no nystagmus, normal finger-to-nose, normal heel-to-shin, negative pronator drift, normal rapid alternating movements, normal tone, no tremor MSK: strength 5/5 and symmetric in bilateral lower extremities, strength 4-/5 normal gait and station, negative Romberg Neck: no midline tenderness, ROM reduced in all directions, right-sided paraspinal muscle spasm Mental Status: alert and oriented x 3, speech articulate, and thought processes clear and goal-directed     No results found for this or any previous visit (from the past 72 hour(s)). No results found.    Assessment and Plan: 58 y.o. female with  .Morgan Garza was seen today for blurred vision and headache.  Diagnoses and all orders for this visit:  Cervicalgia -     DG Cervical Spine Complete  Acute post-traumatic headache, not intractable -     CT Head Wo Contrast -     DG Cervical Spine Complete  Fall (on) (from) other stairs and steps, initial encounter -     DG Cervical Spine Complete   Reassuring neuro exam There is some upper extremity weakness, which may be related to paraspinal neck spasm Neck x-ray to assess for fracture CT head to r/o subdural hemorrhage Counseled on general measures for concussion and supportive care for headache  Patient education and anticipatory guidance given Patient agrees with treatment plan Follow-up in 2 weeks w/Sports Medicine for concussion as needed if symptoms worsen or fail to improve  Darlyne Russian PA-C

## 2018-04-01 NOTE — Progress Notes (Signed)
Called and spoke with patient regarding results of head CT No evidence of hemorrhage Treating for concussion  Counseled on alternating Naproxen and Acetaminophen prn for headache

## 2018-04-01 NOTE — Patient Instructions (Signed)
Concussion, Adult    A concussion is a brain injury from a direct hit (blow) to the head or body. This blow causes the brain to shake quickly back and forth inside the skull. This can damage brain cells and cause chemical changes in the brain. A concussion may also be known as a mild traumatic brain injury (TBI).  Concussions are usually not life-threatening, but the effects of a concussion can be serious. If you have a concussion, you should be very careful to avoid having a second concussion.  What are the causes?  This condition is caused by:  A direct blow to your head, such as from running into another player during a game, being hit in a fight, or hitting your head on a hard surface.  Sudden movement of your body that causes your brain to move back and forth inside the skull, such as in a car crash.  What are the signs or symptoms?  The signs of a concussion can be hard to notice. Early on, they may be missed by you, family members, and health care providers. You may look fine on the outside, but may act or feel differently.  Symptoms are usually temporary, and for most adults, the symptoms improve in 7-10 days. Some symptoms may appear right away, but other symptoms may not show up for hours or days. If your symptoms last longer than normal, you may have post-concussion syndrome. Every head injury is different.  Physical symptoms  Headaches. This can include a feeling of pressure in the head or migraine-like symptoms.  Tiredness (fatigue).  Dizziness.  Problems with coordination or balance.  Vision or hearing problems.  Sensitivity to light or noise.  Nausea or vomiting.  Changes in eating or sleeping patterns.  Numbness or tingling.  Mental and emotional symptoms  Memory problems.  Trouble concentrating, organizing, or making decisions.  Slowness in thinking, acting or reacting, speaking, or reading.  Irritability or mood changes.  Anxiety or depression.  How is this diagnosed?  This condition is diagnosed  based on:  Your symptoms.  A description of your injury.  You may also have tests, including:  Imaging tests, such as a CT scan or MRI. These are done to look for signs of brain injury.  Neuropsychological tests. These measure your thinking, understanding, learning, and remembering abilities.  How is this treated?  Treatment for this condition includes:  Stopping sports or activity if you are injured. Anyone who hits their head or shows signs of a concussion should not return to sports or activities the same day, and they should not return until they are checked by a health care provider.  Physical and mental rest and careful observation, usually at home. Gradually return to your normal activities.  Medicine. You may be prescribed medicines to help with symptoms such as headaches, nausea, or difficulty sleeping.  Avoid taking opioid pain medicine while recovering from a concussion.  Avoiding alcohol and drugs. Alcohol and certain drugs may slow your recovery and can put you at risk of further injury.  Referral to a concussion clinic or rehabilitation center.  How fast you will recover from a concussion depends on many factors. Recovery can take time. It is important to wait to return to activity until a health care provider says that it is safe and your symptoms are completely gone.  Follow these instructions at home:  Activity  Limit activities that require a lot of thought or concentration, such as:  Doing homework or job-related   work.  Watching TV.  Working on the computer.  Playing memory games and puzzles.  Rest. Rest helps your brain heal. Make sure you:  Get plenty of sleep. Most adults should get 7-9 hours of sleep each night.  Rest during the day. Take naps or rest breaks when you feel tired.  Do not do high-risk activities that could cause a second concussion, such as riding a bike or playing sports. Having another concussion before the first one has healed can be dangerous.  Ask your health care provider  when you can return to your normal activities, such as school, work, athletics, and driving. Your ability to react may be slower after a brain injury. Never do these activities if you are dizzy. Your health care provider will likely give you a plan for gradually returning to activities.  General instructions  Take over-the-counter and prescription medicines only as told by your health care provider. Some medicines, such as blood thinners (anticoagulants) and aspirin, may increase the chance of complications, such as bleeding.  Do not drink alcohol until your health care provider says you can.  Watch your symptoms and tell others to do the same. Complications sometimes occur after a concussion. Older adults with a brain injury may have a higher risk of serious complications.  Tell your work manager, teachers, school nurse, school counselor, coach, or athletic trainer about your injury, symptoms, and restrictions. Tell them about what you can or cannot do. They should watch you for worsening symptoms.  Keep all follow-up visits as told by your health care provider. This is important.  How is this prevented?  Avoiding another brain injury is very important, especially as you recover. In rare cases, another injury can lead to permanent brain damage, brain swelling, or death. The risk of this is greatest during the first 7-10 days after a head injury. Avoid injuries by:  Avoiding activities that could lead to a second concussion, such as contact or recreational sports, until your health care provider says it is okay.  Taking these actions once you have returned to sports or activities:  Avoiding plays or moves that can cause you to crash into another person. This is how most concussions occur.  Following the rules and being respectful of other players. Do not engage in violent or illegal plays.  Getting regular exercise that includes strength and balance training.  Wear a properly fitting helmet during sports, biking, or  other activities. Helmets can help protect you from serious skull and brain injuries, but they do not protect you from a concussion. Even when wearing a helmet, you should avoid being hit in the head.  Contact a health care provider if:  Your symptoms get worse or they do not improve.  You have new symptoms.  You have another injury.  Get help right away if:  You have severe or worsening headaches.  You have weakness or numbness in any part of your body.  You are confused.  Your coordination gets worse.  You vomit repeatedly.  You are sleepier than normal.  You have convulsions.  Your speech is slurred.  You cannot recognize people or places.  You have a seizure.  It is difficult to wake you up.  You have unusual behavior changes.  You have changes in your vision.  You lose consciousness.  Summary  A concussion is a brain injury from a direct hit (blow) to your head or body.  You may have imaging tests and neuropsychological tests to diagnose   a concussion.  Treatment for this condition includes physical and mental rest and careful observation.  Ask your health care provider when you can return to your normal activities, such as school, work, athletics, and driving. Follow safety instructions as told by your health care provider.  This information is not intended to replace advice given to you by your health care provider. Make sure you discuss any questions you have with your health care provider.  Document Released: 06/06/2003 Document Revised: 04/22/2017 Document Reviewed: 04/22/2017  Elsevier Interactive Patient Education  2019 [employer]

## 2018-04-04 ENCOUNTER — Ambulatory Visit: Payer: Self-pay | Admitting: Physician Assistant

## 2018-04-06 ENCOUNTER — Encounter: Payer: Self-pay | Admitting: Physician Assistant

## 2018-04-06 DIAGNOSIS — W108XXA Fall (on) (from) other stairs and steps, initial encounter: Secondary | ICD-10-CM | POA: Insufficient documentation

## 2018-04-06 DIAGNOSIS — S060XAA Concussion with loss of consciousness status unknown, initial encounter: Secondary | ICD-10-CM | POA: Insufficient documentation

## 2018-04-06 DIAGNOSIS — M542 Cervicalgia: Secondary | ICD-10-CM | POA: Insufficient documentation

## 2018-04-06 DIAGNOSIS — S060X9A Concussion with loss of consciousness of unspecified duration, initial encounter: Secondary | ICD-10-CM | POA: Insufficient documentation

## 2018-04-06 DIAGNOSIS — G44319 Acute post-traumatic headache, not intractable: Secondary | ICD-10-CM | POA: Insufficient documentation

## 2018-04-15 ENCOUNTER — Ambulatory Visit (INDEPENDENT_AMBULATORY_CARE_PROVIDER_SITE_OTHER): Payer: Self-pay | Admitting: Family Medicine

## 2018-04-15 VITALS — BP 176/76 | HR 70 | Ht 69.0 in | Wt 267.0 lb

## 2018-04-15 DIAGNOSIS — S060X0A Concussion without loss of consciousness, initial encounter: Secondary | ICD-10-CM

## 2018-04-15 DIAGNOSIS — G44221 Chronic tension-type headache, intractable: Secondary | ICD-10-CM

## 2018-04-15 DIAGNOSIS — M542 Cervicalgia: Secondary | ICD-10-CM

## 2018-04-15 NOTE — Patient Instructions (Signed)
Thank you for coming in today. Attend physical therapy.  Ok to continue medicine for headache.  Recheck in 2-3 weeks.  Advance activity as tolerated.  Try to wean off ibuprofen if able.    Concussion, Adult  A concussion is a brain injury from a direct hit (blow) to the head or body. This blow causes the brain to shake quickly back and forth inside the skull. This can damage brain cells and cause chemical changes in the brain. A concussion may also be known as a mild traumatic brain injury (TBI). Concussions are usually not life-threatening, but the effects of a concussion can be serious. If you have a concussion, you should be very careful to avoid having a second concussion. What are the causes? This condition is caused by:  A direct blow to your head, such as from running into another player during a game, being hit in a fight, or hitting your head on a hard surface.  Sudden movement of your body that causes your brain to move back and forth inside the skull, such as in a car crash. What are the signs or symptoms? The signs of a concussion can be hard to notice. Early on, they may be missed by you, family members, and health care providers. You may look fine on the outside, but may act or feel differently. Symptoms are usually temporary, and for most adults, the symptoms improve in 7-10 days. Some symptoms may appear right away, but other symptoms may not show up for hours or days. If your symptoms last longer than normal, you may have post-concussion syndrome. Every head injury is different. Physical symptoms  Headaches. This can include a feeling of pressure in the head or migraine-like symptoms.  Tiredness (fatigue).  Dizziness.  Problems with coordination or balance.  Vision or hearing problems.  Sensitivity to light or noise.  Nausea or vomiting.  Changes in eating or sleeping patterns.  Numbness or tingling. Mental and emotional symptoms  Memory problems.  Trouble  concentrating, organizing, or making decisions.  Slowness in thinking, acting or reacting, speaking, or reading.  Irritability or mood changes.  Anxiety or depression. How is this diagnosed? This condition is diagnosed based on:  Your symptoms.  A description of your injury. You may also have tests, including:  Imaging tests, such as a CT scan or MRI. These are done to look for signs of brain injury.  Neuropsychological tests. These measure your thinking, understanding, learning, and remembering abilities. How is this treated? Treatment for this condition includes:  Stopping sports or activity if you are injured. Anyone who hits their head or shows signs of a concussion should not return to sports or activities the same day, and they should not return until they are checked by a health care provider.  Physical and mental rest and careful observation, usually at home. Gradually return to your normal activities.  Medicine. You may be prescribed medicines to help with symptoms such as headaches, nausea, or difficulty sleeping. ? Avoid taking opioid pain medicine while recovering from a concussion.  Avoiding alcohol and drugs. Alcohol and certain drugs may slow your recovery and can put you at risk of further injury.  Referral to a concussion clinic or rehabilitation center. How fast you will recover from a concussion depends on many factors. Recovery can take time. It is important to wait to return to activity until a health care provider says that it is safe and your symptoms are completely gone. Follow these instructions at home:  Activity  Limit activities that require a lot of thought or concentration, such as: ? Doing homework or job-related work. ? Watching TV. ? Working on the computer. ? Playing memory games and puzzles.  Rest. Rest helps your brain heal. Make sure you: ? Get plenty of sleep. Most adults should get 7-9 hours of sleep each night. ? Rest during the day.  Take naps or rest breaks when you feel tired.  Do not do high-risk activities that could cause a second concussion, such as riding a bike or playing sports. Having another concussion before the first one has healed can be dangerous.  Ask your health care provider when you can return to your normal activities, such as school, work, athletics, and driving. Your ability to react may be slower after a brain injury. Never do these activities if you are dizzy. Your health care provider will likely give you a plan for gradually returning to activities. General instructions  Take over-the-counter and prescription medicines only as told by your health care provider. Some medicines, such as blood thinners (anticoagulants) and aspirin, may increase the chance of complications, such as bleeding.  Do not drink alcohol until your health care provider says you can.  Watch your symptoms and tell others to do the same. Complications sometimes occur after a concussion. Older adults with a brain injury may have a higher risk of serious complications.  Tell your work Freight forwarder, teachers, Government social research officer, school counselor, coach, or Product/process development scientist about your injury, symptoms, and restrictions. Tell them about what you can or cannot do. They should watch you for worsening symptoms.  Keep all follow-up visits as told by your health care provider. This is important. How is this prevented? Avoiding another brain injury is very important, especially as you recover. In rare cases, another injury can lead to permanent brain damage, brain swelling, or death. The risk of this is greatest during the first 7-10 days after a head injury. Avoid injuries by:  Avoiding activities that could lead to a second concussion, such as contact or recreational sports, until your health care provider says it is okay.  Taking these actions once you have returned to sports or activities: ? Avoiding plays or moves that can cause you to crash into  another person. This is how most concussions occur. ? Following the rules and being respectful of other players. Do not engage in violent or illegal plays.  Getting regular exercise that includes strength and balance training. Wear a properly fitting helmet during sports, biking, or other activities. Helmets can help protect you from serious skull and brain injuries, but they do not protect you from a concussion. Even when wearing a helmet, you should avoid being hit in the head. Contact a health care provider if:  Your symptoms get worse or they do not improve.  You have new symptoms.  You have another injury. Get help right away if:  You have severe or worsening headaches.  You have weakness or numbness in any part of your body.  You are confused.  Your coordination gets worse.  You vomit repeatedly.  You are sleepier than normal.  You have convulsions.  Your speech is slurred.  You cannot recognize people or places.  You have a seizure.  It is difficult to wake you up.  You have unusual behavior changes.  You have changes in your vision.  You lose consciousness. Summary  A concussion is a brain injury from a direct hit (blow) to your head or  body.  You may have imaging tests and neuropsychological tests to diagnose a concussion.  Treatment for this condition includes physical and mental rest and careful observation.  Ask your health care provider when you can return to your normal activities, such as school, work, athletics, and driving. Follow safety instructions as told by your health care provider. This information is not intended to replace advice given to you by your health care provider. Make sure you discuss any questions you have with your health care provider. Document Released: 06/06/2003 Document Revised: 04/22/2017 Document Reviewed: 04/22/2017 Elsevier Interactive Patient Education  2019 [employer].

## 2018-04-15 NOTE — Progress Notes (Signed)
Morgan Garza is a 58 y.o. female who presents to [facility]: [facility] today for headache concern for concussion.  Patient was seen by her primary care provider on January 3.  She had suffered an injury after falling at home about 2 weeks prior to that around December 20.  She had headache and blurred vision. Her neurologic exam was relatively normal.  She had further evaluation with normal noncontrast head CT scan, and x-ray C-spine showing spasm and some degenerative changes at C4-C7 but no acute injury.  She was referred to me for follow-up and evaluation of possible concussion.  In the interim she notes that she is continued to have a headache which is slowly improving.  Additionally she notes some mild symptoms with memory and attention.  She notes overall she is improving but still is symptomatic.  Her headache is moderate.  She notes that when she does not take her ibuprofen or Tylenol her headache does return.  She notes her headache comes back faster or is a little worse after she is done a lot of work.   ROS as above:  Exam:  BP (!) 176/76   Pulse 70   Ht 5\' 9"  (1.753 m)   Wt 267 lb (121.1 kg)   LMP 11/09/2010   BMI 39.43 kg/m  Wt Readings from Last 5 Encounters:  04/15/18 267 lb (121.1 kg)  04/01/18 258 lb 11.2 oz (117.3 kg)  02/02/18 258 lb (117 kg)  01/04/18 259 lb (117.5 kg)  01/03/18 256 lb (116.1 kg)    Gen: Well NAD HEENT: EOMI,  MMM Lungs: Normal work of breathing. CTABL Heart: RRR no MRG Abd: NABS, Soft. Nondistended, Nontender Exts: Brisk capillary refill, warm and well perfused.  Neuro alert and oriented normal coordination and gait. MSK: C-spine nontender to spinal midline.  Tender palpation right trapezius and bilateral cervical paraspinal musculature. Decreased cervical motion. Upper extremity strength reflexes and sensation are equal and  normal throughout. Lower extremity strength reflexes and sensation are equal and normal throughout.  SCAT5: Total number of symptoms:  11/22 Symptom severity score:  35/132 Cognitive assessment: 5/5 Immediate memory score: 11/15 Concentration score:  2/5 Neck exam:    NL Balance exam:   Normal double leg.  Slight impairment tandem.  Moderate impairment single-leg Coordination exam:  Normal bilaterally Delayed recall score  4/5   Lab and Radiology Results Dg Cervical Spine Complete  Result Date: 04/01/2018 CLINICAL DATA:  Pain post fall EXAM: CERVICAL SPINE - COMPLETE 4+ VIEW COMPARISON:  None. FINDINGS: No fracture or dislocation. Straightening of the normal lordosis. Mild narrowing of C5-6 and C6-7 interspaces. Vertebral endplate spurring U2-P5. No prevertebral soft tissue swelling. Foraminal encroachment secondary to uncovertebral spurring C4-5, C5-6, C6-7. Missing dentition and dental restorations. IMPRESSION: 1. Negative for fracture or other acute bone abnormality. 2. Degenerative changes C4-C7 as above. 3. Loss of the normal cervical spine lordosis, which may be secondary to positioning, spasm, or soft tissue injury. Electronically Signed   By: Lucrezia Europe M.D.   On: 04/01/2018 16:28   Ct Head Wo Contrast  Result Date: 04/01/2018 CLINICAL DATA:  Fall 2 weeks ago with headache EXAM: CT HEAD WITHOUT CONTRAST TECHNIQUE: Contiguous axial images were obtained from the base of the skull through the vertex without intravenous contrast. COMPARISON:  None. FINDINGS: Brain: No acute territorial infarction, hemorrhage or intracranial mass. The ventricles are normal in size. Vascular: No hyperdense vessel or unexpected calcification. Skull: Normal.  Negative for fracture or focal lesion. Sinuses/Orbits: No acute finding. Other: None IMPRESSION: Negative non contrasted CT appearance of the brain. Electronically Signed   By: Donavan Foil M.D.   On: 04/01/2018 17:20   Mm 3d Screen Breast  Bilateral  Result Date: 03/02/2018 CLINICAL DATA:  Screening. EXAM: DIGITAL SCREENING BILATERAL MAMMOGRAM WITH TOMO AND CAD COMPARISON:  Previous exam(s). ACR Breast Density Category b: There are scattered areas of fibroglandular density. FINDINGS: There are no findings suspicious for malignancy. Images were processed with CAD. IMPRESSION: No mammographic evidence of malignancy. A result letter of this screening mammogram will be mailed directly to the patient. RECOMMENDATION: Screening mammogram in one year. (Code:SM-B-01Y) BI-RADS CATEGORY  1: Negative. Electronically Signed   By: Everlean Alstrom M.D.   On: 03/02/2018 16:29   I personally (independently) visualized and performed the interpretation of the images attached in this note.     Assessment and Plan: 58 y.o. female with  Concussion.  With persistent headache and mild memory concentration impairment.  Overall symptoms are improving.  Plan for watchful waiting and advancing activity as tolerated.  Additionally plan to try weaning off of ibuprofen as that is elevated blood pressure.  Fortunately neurologic exam today was largely unremarkable except for mildly impaired balance.  Plan to recheck in a few weeks.  If symptoms are still present will consider further management.  Would consider Topamax as a possibility for headache prevention.  Additionally discussed the possibility that she may develop medication overuse her medication rebound headache.  Additionally believe a component of the headache and neck pain is coming from cervical spasm following her fall.  She would likely benefit from physical therapy.  Plan to refer to physical therapy.  Recheck 3 weeks or so. Orders Placed This Encounter  Procedures  . Ambulatory referral to Physical Therapy    Referral Priority:   Routine    Referral Type:   Physical Medicine    Referral Reason:   Specialty Services Required    Requested Specialty:   Physical Therapy   No orders of the  defined types were placed in this encounter.    Historical information moved to improve visibility of documentation.  Past Medical History:  Diagnosis Date  . Diabetes mellitus without complication ([place])   . History of colon polyps   . Hyperlipidemia   . Hypertension    Past Surgical History:  Procedure Laterality Date  . NO PAST SURGERIES     Social History   Tobacco Use  . Smoking status: Former Smoker    Packs/day: 0.50    Years: 1.00    Pack years: 0.50    Types: Cigarettes    Last attempt to quit: 03/30/2018    Years since quitting: 0.0  . Smokeless tobacco: Never Used  Substance Use Topics  . Alcohol use: Not Currently    Frequency: Never   family history includes COPD in her mother; Cancer in her father; Gastric cancer in her father; Hypertension in her mother; Testicular cancer in her son.  Medications: Current Outpatient Medications  Medication Sig Dispense Refill  . amLODipine (NORVASC) 10 MG tablet Take 1 tablet (10 mg total) by mouth every morning. 90 tablet 1  . atorvastatin (LIPITOR) 20 MG tablet Take 1 tablet (20 mg total) by mouth at bedtime. 90 tablet 1  . Cholecalciferol (VITAMIN D3) 5000 units TABS Take by mouth.    Marland Kitchen glipiZIDE (GLUCOTROL XL) 10 MG 24 hr tablet Take 1 tablet (10 mg total) by mouth daily  before breakfast. 90 tablet 1  . glucose blood (PRECISION QID TEST) test strip Use one strip to test blood sugar.    . HYDROcodone-acetaminophen (NORCO/VICODIN) 5-325 MG tablet Take 1 tablet by mouth every 8 (eight) hours as needed for moderate pain. 15 tablet 0  . ibuprofen (ADVIL,MOTRIN) 800 MG tablet Take by mouth.    Marland Kitchen lisinopril-hydrochlorothiazide (PRINZIDE,ZESTORETIC) 20-25 MG tablet Take 1 tablet by mouth every morning. 90 tablet 1  . metFORMIN (GLUCOPHAGE-XR) 500 MG 24 hr tablet Take 2 tablets (1,000 mg total) by mouth daily with supper. 180 tablet 1  . nicotine (NICODERM CQ) 14 mg/24hr patch Place 1 patch (14 mg total) onto the skin daily. 28  patch 5   No current facility-administered medications for this visit.    Allergies  Allergen Reactions  . Bupropion Hives       . Varenicline Other (See Comments)    Vision changes, "everything was enhanced"     Discussed warning signs or symptoms. Please see discharge instructions. Patient expresses understanding.

## 2018-04-28 ENCOUNTER — Other Ambulatory Visit: Payer: Self-pay

## 2018-04-28 ENCOUNTER — Encounter: Payer: Self-pay | Admitting: Rehabilitative and Restorative Service Providers"

## 2018-04-28 ENCOUNTER — Ambulatory Visit (INDEPENDENT_AMBULATORY_CARE_PROVIDER_SITE_OTHER): Payer: Self-pay | Admitting: Rehabilitative and Restorative Service Providers"

## 2018-04-28 DIAGNOSIS — M542 Cervicalgia: Secondary | ICD-10-CM

## 2018-04-28 DIAGNOSIS — R293 Abnormal posture: Secondary | ICD-10-CM

## 2018-04-28 DIAGNOSIS — R29898 Other symptoms and signs involving the musculoskeletal system: Secondary | ICD-10-CM

## 2018-04-28 DIAGNOSIS — G44201 Tension-type headache, unspecified, intractable: Secondary | ICD-10-CM
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
## 2018-04-28 NOTE — Patient Instructions (Addendum)
Access Code: [email or web address]  URL: [email or web address]  Date: 04/28/2018  Prepared by: Gillermo Murdoch   Exercises  Seated Cervical Retraction - 10 reps - 1 sets - 3x daily - 7x weekly  Standing Scapular Retraction - 10 reps - 1 sets - 10 hold - 3x daily - 7x weekly  Shoulder External Rotation and Scapular Retraction - 10 reps - 1 sets - hold - 3x daily - 7x weekly  Standing Scapular Retraction in Abduction - 10 reps - 1 sets - 3x daily - 7x weekly  Doorway Pec Stretch at 60 Degrees Abduction - 3 reps - 1 sets - 3x daily - 7x weekly  Doorway Pec Stretch at 90 Degrees Abduction - 3 reps - 1 sets - 30 seconds hold - 3x daily - 7x weekly  Doorway Pec Stretch at 120 Degrees Abduction - 3 reps - 1 sets - 30 second hold hold - 3x daily - 7x weekly  Patient Education  TENS Unit   Trigger Point Dry Needling  . What is Trigger Point Dry Needling (DN)? o DN is a physical therapy technique used to treat muscle pain and dysfunction. Specifically, DN helps deactivate muscle trigger points (muscle knots).  o A thin filiform needle is used to penetrate the skin and stimulate the underlying trigger point. The goal is for a local twitch response (LTR) to occur and for the trigger point to relax. No medication of any kind is injected during the procedure.   . What Does Trigger Point Dry Needling Feel Like?  o The procedure feels different for each individual patient. Some patients report that they do not actually feel the needle enter the skin and overall the process is not painful. Very mild bleeding may occur. However, many patients feel a deep cramping in the muscle in which the needle was inserted. This is the local twitch response.   Marland Kitchen How Will I feel after the treatment? o Soreness is normal, and the onset of soreness may not occur for a few hours. Typically this soreness does not last longer than two days.  o Bruising is uncommon, however; ice can be used to decrease any possible bruising.   o In rare cases feeling tired or nauseous after the treatment is normal. In addition, your symptoms may get worse before they get better, this period will typically not last longer than 24 hours.   . What Can I do After My Treatment? o Increase your hydration by drinking more water for the next 24 hours. o You may place ice or heat on the areas treated that have become sore, however, do not use heat on inflamed or bruised areas. Heat often brings more relief post needling. o You can continue your regular activities, but vigorous activity is not recommended initially after the treatment for 24 hours. o DN is best combined with other physical therapy such as strengthening, stretching, and other therapies.

## 2018-05-03 ENCOUNTER — Encounter: Payer: Self-pay | Admitting: Rehabilitative and Restorative Service Providers"

## 2018-05-03 ENCOUNTER — Ambulatory Visit: Payer: Self-pay | Admitting: Family Medicine

## 2018-05-03 ENCOUNTER — Telehealth: Payer: Self-pay | Admitting: Rehabilitative and Restorative Service Providers"

## 2018-05-03 NOTE — Telephone Encounter (Signed)
Called patient re- missed appointment. Ask Verdene Lennert to call re-scheduled appointments.   Celyn P. Helene Kelp PT, MPH 05/03/18 10:05 AM

## 2018-05-05 ENCOUNTER — Encounter: Payer: Self-pay | Admitting: Rehabilitative and Restorative Service Providers"

## 2018-05-09 ENCOUNTER — Telehealth: Payer: Self-pay | Admitting: Physician Assistant

## 2018-05-09 DIAGNOSIS — N907 Vulvar cyst: Secondary | ICD-10-CM

## 2018-05-09 NOTE — Telephone Encounter (Signed)
Patient would like a referral to a dermatologist. States that PCP already knows what this is concerning. Please contact and advise once referral is placed.

## 2018-05-09 NOTE — Addendum Note (Signed)
Addended by: Delrae Alfred on: 05/09/2018 03:43 PM   Modules accepted: Orders

## 2018-05-09 NOTE — Telephone Encounter (Signed)
Spoke with patient and she did confirm that this was about her labial cyst.  Notified patient that a referral to wmones would be placed. Pt verbalized understanding. -EH/RMA

## 2018-05-09 NOTE — Telephone Encounter (Signed)
If this is regarding labial cysts, referral was placed to Gynecology for removal

## 2018-05-10 NOTE — Addendum Note (Signed)
Addended by: Delrae Alfred on: 05/10/2018 08:09 AM   Modules accepted: Orders

## 2018-05-11 ENCOUNTER — Encounter: Payer: Self-pay | Admitting: Rehabilitative and Restorative Service Providers"

## 2018-05-11 ENCOUNTER — Ambulatory Visit (INDEPENDENT_AMBULATORY_CARE_PROVIDER_SITE_OTHER): Payer: Self-pay | Admitting: Rehabilitative and Restorative Service Providers"

## 2018-05-11 DIAGNOSIS — R293 Abnormal posture: Secondary | ICD-10-CM

## 2018-05-11 DIAGNOSIS — M542 Cervicalgia: Secondary | ICD-10-CM

## 2018-05-11 DIAGNOSIS — R29898 Other symptoms and signs involving the musculoskeletal system: Secondary | ICD-10-CM

## 2018-05-11 DIAGNOSIS — G44201 Tension-type headache, unspecified, intractable: Secondary | ICD-10-CM

## 2018-05-11 NOTE — Therapy (Signed)
[place], [place], [address] Phone: [phone]   Fax:  [phone]  Physical Therapy Treatment  Patient Details  Name: Morgan Garza MRN: [id number] Date of Birth: 11-25-1960 Referring Provider (PT): Dr Lynne Leader    Encounter Date: 05/11/2018  PT End of Session - 05/11/18 1441    Visit Number  2    Number of Visits  12    Date for PT Re-Evaluation  06/09/18    PT Start Time  1740    PT Stop Time  1527    PT Time Calculation (min)  48 min    Activity Tolerance  Patient tolerated treatment well       Past Medical History:  Diagnosis Date  . Diabetes mellitus without complication ([place])   . History of colon polyps   . Hyperlipidemia   . Hypertension     Past Surgical History:  Procedure Laterality Date  . NO PAST SURGERIES      There were no vitals filed for this visit.  Subjective Assessment - 05/11/18 1442    Subjective  Patient reports that headaches have improved significantly. She has only occasional headaches. Patient continues to have pain in the lower part of the neck. Neck pain is constant. She notices that she has to hold her head and neck to turn her head at times.     Currently in Pain?  Yes    Pain Score  3     Pain Location  Neck    Pain Orientation  Right    Pain Descriptors / Indicators  Tightness;Dull    Pain Type  Acute pain    Pain Radiating Towards  upper shoulder area     Pain Onset  More than a month ago    Pain Frequency  Intermittent    Aggravating Factors   turning head to the side; using her neck     Pain Relieving Factors  direct pressure          [facility] PT Assessment - 05/11/18 0001      Assessment   Medical Diagnosis  HA and neck pain     Referring Provider (PT)  Dr Lynne Leader     Onset Date/Surgical Date  03/19/19    Hand Dominance  Right    Next MD Visit  05/02/2018    Prior Therapy  for neck yrs ago       AROM   Cervical Flexion  52     Cervical Extension  30 discomfort     Cervical - Right Side Bend  22 tightness     Cervical - Left Side Bend  22 tightness     Cervical - Right Rotation  47 tightness     Cervical - Left Rotation  51 tightness       Palpation   Palpation comment  muscular tightness through the ant/lat/post cervical musculature; pecs; upper trap; leveator; teres                    [facility] Adult PT Treatment/Exercise - 05/11/18 0001      Neuro Re-ed    Neuro Re-ed Details   postural education/correction      Shoulder Exercises: Standing   Other Standing Exercises  axial extension 10 sec x 5; scap squeeze 10 sec x 10; L's x10; W's x 10 with noodle       Shoulder Exercises: Stretch   Other Shoulder Stretches  3 way doorway  stretch 30 sec x 1 rep each position       Moist Heat Therapy   Number Minutes Moist Heat  15 Minutes    Moist Heat Location  Cervical   thoracic      Electrical Stimulation   Electrical Stimulation Location  bilat occipital area and Rt upper trap     Electrical Stimulation Action  IFC    Electrical Stimulation Parameters  to tolerance    Electrical Stimulation Goals  Pain;Tone      Manual Therapy   Manual therapy comments  pt supine hooklying     Joint Mobilization  upper thoracic and cervical CPA mobs Grade II/III    Soft tissue mobilization  deep tissue work through the ant/lat/post cervical spine musculature; upper traps; leveator; pecs; anterior shoulder Rt > Lt     Manual Traction  manual traction following manual work 20-30 sec pull x 2 reps - traction through the long arm through Rt shoulder       Neck Exercises: Stretches   Upper Trapezius Stretch  Right;Left;2 reps;10 seconds   seated with chin tuck    Other Neck Stretches  shoulder rolls fwd/back x 10 each              PT Education - 05/11/18 1453    Education Details  HEP     Person(s) Educated  Patient    Methods  Explanation;Demonstration;Tactile cues;Verbal cues;Handout    Comprehension   Verbalized understanding;Returned demonstration;Verbal cues required;Tactile cues required          PT Long Term Goals - 04/28/18 1640      PT LONG TERM GOAL #1   Title  Improve posture and alignment with patient to demonstrate improed upright posture with posterior shoulder girdle engaged 06/09/2018    Time  6    Period  Weeks    Status  New      PT LONG TERM GOAL #2   Title  Increase cervical extension and lateral cervical flexion by 5-10 deg in each direction without pain or discomfort 06/09/2018    Time  6    Period  Weeks    Status  New      PT LONG TERM GOAL #3   Title  Decrease frequency, intensity and duration of headaches by 50-75% allowing patient to participate in normal household and work activities with less pain 06/09/2018    Time  6    Period  Weeks    Status  New      PT LONG TERM GOAL #4   Title  Independent in HEP 06/09/2018    Time  6    Period  Weeks    Status  New      PT LONG TERM GOAL #5   Title  Improve FOTO to </= 35% limitation 06/09/2018    Time  6    Period  Weeks    Status  New            Plan - 05/11/18 1442    Clinical Impression Statement  [name] returns for second visit following evaluation 04/28/2018. She reports resolving headaches but continued cervical pain and tightness. ROM is decreased in cervical spine compared to initial visit. patient has persistent muscular tightness Rt > Lt ant/lat/posterior cervical musculature into the upper shoulder area. She responded well to manual work. She is interested in dry needling - which we discussed and can proceed with at next visit. (She arrived 9 min late for appointment today.)  Rehab Potential  Good    PT Frequency  2x / week    PT Duration  6 weeks    PT Treatment/Interventions  Patient/family education;ADLs/Self Care Home Management;Cryotherapy;Electrical Stimulation;Iontophoresis 4mg /ml Dexamethasone;Moist Heat;Ultrasound;Dry needling;Functional mobility training;Therapeutic  activities;Therapeutic exercise;Manual lymph drainage;Neuromuscular re-education    PT Next Visit Plan  review HEP; progress with snow angle stretch; cervical ROM stretching; add DN and manual work for cervical and posterior shoulder areas; modalities as indicated     PT Home Exercise Plan   Access Code: [email or web address]     Consulted and Agree with Plan of Care  Patient       Patient will benefit from skilled therapeutic intervention in order to improve the following deficits and impairments:  Postural dysfunction, Improper body mechanics, Pain, Increased fascial restricitons, Increased muscle spasms, Hypomobility, Decreased range of motion, Decreased mobility, Decreased activity tolerance  Visit Diagnosis: Acute intractable tension-type headache  Cervicalgia  Abnormal posture  Other symptoms and signs involving the musculoskeletal system     Problem List Patient Active Problem List   Diagnosis Date Noted  . Cervicalgia 04/06/2018  . Closed head injury with concussion 04/06/2018  . Fall (on) (from) other stairs and steps, initial encounter 04/06/2018  . Acute post-traumatic headache, not intractable 04/06/2018  . Cigarette nicotine dependence without complication 10/93/2355  . Epidermoid cyst of labia majora 02/06/2018  . Mass of axilla, left 02/04/2018  . Postmenopausal 02/02/2018  . History of colon polyps   . Cigarette smoker 09/03/2016  . Allergic rhinitis 09/04/2015  . BMI 39.0-39.9,adult 09/04/2015  . Hypertension associated with diabetes ([place]) 09/04/2015  . Hyperlipidemia 09/04/2015  . Menopausal symptom 09/04/2015  . Tobacco use 01/23/2013  . Type 2 diabetes mellitus without complication, without long-term current use of insulin ([place]) 04/26/2012  . Sciatica 04/22/2012    Rawn Quiroa Nilda Simmer PT, MPH  05/11/2018, 5:11 PM  [place], [place], [address] Phone: [phone]   Fax:   [phone]  Name: Morgan Garza MRN: [id number] Date of Birth: [birth year]

## 2018-05-11 NOTE — Patient Instructions (Signed)
Access Code: [email or web address]  URL: [email or web address]  Date: 05/11/2018  Prepared by: Gillermo Murdoch   Exercises  Neck Sidebending - 3 reps - 1 sets - 10 sec hold - 2x daily - 7x weekly  Shoulder rolls fwd/back

## 2018-05-20 ENCOUNTER — Ambulatory Visit: Payer: Self-pay | Admitting: Rehabilitative and Restorative Service Providers"

## 2018-05-20 ENCOUNTER — Encounter: Payer: Self-pay | Admitting: Rehabilitative and Restorative Service Providers"

## 2018-05-20 DIAGNOSIS — M542 Cervicalgia: Secondary | ICD-10-CM

## 2018-05-20 DIAGNOSIS — R293 Abnormal posture: Secondary | ICD-10-CM

## 2018-05-20 DIAGNOSIS — G44201 Tension-type headache, unspecified, intractable: Secondary | ICD-10-CM

## 2018-05-20 DIAGNOSIS — R29898 Other symptoms and signs involving the musculoskeletal system: Secondary | ICD-10-CM

## 2018-05-20 NOTE — Therapy (Signed)
[facility] [place] [facility] [place], [place], [address] Phone: [phone]   Fax:  [phone]  Physical Therapy Treatment  Patient Details  Name: Morgan Garza MRN: [id number] Date of Birth: 05-20-1960 Referring Provider (PT): Dr Lynne Leader    Encounter Date: 05/20/2018  PT End of Session - 05/20/18 1540    Visit Number  3    Number of Visits  12    Date for PT Re-Evaluation  06/09/18    PT Start Time  1536    PT Stop Time  1627    PT Time Calculation (min)  51 min    Activity Tolerance  Patient tolerated treatment well       Past Medical History:  Diagnosis Date  . Diabetes mellitus without complication ([place])   . History of colon polyps   . Hyperlipidemia   . Hypertension     Past Surgical History:  Procedure Laterality Date  . NO PAST SURGERIES      There were no vitals filed for this visit.  Subjective Assessment - 05/20/18 1541    Subjective  Patient reports that she is feeling so much better. She has not had a headache since last visit. She continues to have some discomfort in neck with side bending and rotation     Currently in Pain?  No/denies                       [facility] Adult PT Treatment/Exercise - 05/20/18 0001      Shoulder Exercises: Standing   Extension  Strengthening;Both;10 reps;Theraband    Theraband Level (Shoulder Extension)  Level 4 (Blue)    Row  Strengthening;Both;10 reps;Theraband    Theraband Level (Shoulder Row)  Level 4 (Blue)    Retraction  Strengthening;Both;10 reps;Theraband    Theraband Level (Shoulder Retraction)  Level 2 (Red)    Other Standing Exercises  axial extension 10 sec x 5; scap squeeze 10 sec x 10; L's x10; W's x 10 with noodle     Other Standing Exercises  reach for floor stretch for upper trap wit hlateral cervical flexion 10 sec hold x 3 each side standing at noodle       Shoulder Exercises: Stretch   Other Shoulder Stretches  3 way doorway  stretch 30 sec x 1 rep each position       Moist Heat Therapy   Number Minutes Moist Heat  14 Minutes    Moist Heat Location  Cervical   thoracic      Electrical Stimulation   Electrical Stimulation Location  bilat occipital area and upper trap     Electrical Stimulation Action  IFC    Electrical Stimulation Parameters  to tolerance    Electrical Stimulation Goals  Pain;Tone      Manual Therapy   Manual therapy comments  pt supine and prone     Soft tissue mobilization  deep tissue work through the ant/lat/post cervical spine musculature; upper traps; leveator - tightness Rt > Lt       Neck Exercises: Stretches   Upper Trapezius Stretch  Right;Left;2 reps;10 seconds   seated with chin tuck    Other Neck Stretches  cervical rotation x 2 each side to pt tolerance        Trigger Point Dry Needling - 05/20/18 1624    Consent Given?  Yes    Education Handout Provided  Yes    Muscles Treated Upper Body  Upper trapezius;Longissimus  bilat - pt prone    Upper Trapezius Response  Palpable increased muscle length    Longissimus Response  Palpable increased muscle length   upper to mid cervical           PT Education - 05/20/18 1554    Education Details  HEP DN     Person(s) Educated  Patient    Methods  Explanation;Demonstration;Tactile cues;Verbal cues;Handout    Comprehension  Verbalized understanding;Returned demonstration;Verbal cues required;Tactile cues required          PT Long Term Goals - 04/28/18 1640      PT LONG TERM GOAL #1   Title  Improve posture and alignment with patient to demonstrate improed upright posture with posterior shoulder girdle engaged 06/09/2018    Time  6    Period  Weeks    Status  New      PT LONG TERM GOAL #2   Title  Increase cervical extension and lateral cervical flexion by 5-10 deg in each direction without pain or discomfort 06/09/2018    Time  6    Period  Weeks    Status  New      PT LONG TERM GOAL #3   Title  Decrease  frequency, intensity and duration of headaches by 50-75% allowing patient to participate in normal household and work activities with less pain 06/09/2018    Time  6    Period  Weeks    Status  New      PT LONG TERM GOAL #4   Title  Independent in HEP 06/09/2018    Time  6    Period  Weeks    Status  New      PT LONG TERM GOAL #5   Title  Improve FOTO to </= 35% limitation 06/09/2018    Time  6    Period  Weeks    Status  New            Plan - 05/20/18 1541    Clinical Impression Statement  No pain or headaches per pt report. She is doing some of the exercises - notices that she has discomfort with lateral cervical flexion and rotation. Added posterior shoulder girdle strengthening and continued with mobility/ROM exercises. Tolerated DN fairly well with palpable decrease in muscular tightness following treatment. Patient will benefit from continued treatment to achieve maximum rehab goals.     Rehab Potential  Good    PT Frequency  2x / week    PT Duration  6 weeks    PT Treatment/Interventions  Patient/family education;ADLs/Self Care Home Management;Cryotherapy;Electrical Stimulation;Iontophoresis 4mg /ml Dexamethasone;Moist Heat;Ultrasound;Dry needling;Functional mobility training;Therapeutic activities;Therapeutic exercise;Manual lymph drainage;Neuromuscular re-education    PT Next Visit Plan  review HEP; progress with snow angle stretch; cervical ROM stretching; add DN and manual work for cervical and posterior shoulder areas; modalities as indicated     PT Home Exercise Plan   Access Code: [email or web address]     Consulted and Agree with Plan of Care  Patient       Patient will benefit from skilled therapeutic intervention in order to improve the following deficits and impairments:  Postural dysfunction, Improper body mechanics, Pain, Increased fascial restricitons, Increased muscle spasms, Hypomobility, Decreased range of motion, Decreased mobility, Decreased activity tolerance  Visit  Diagnosis: Acute intractable tension-type headache  Cervicalgia  Abnormal posture  Other symptoms and signs involving the musculoskeletal system     Problem List Patient Active Problem List   Diagnosis Date Noted  . Cervicalgia  04/06/2018  . Closed head injury with concussion 04/06/2018  . Fall (on) (from) other stairs and steps, initial encounter 04/06/2018  . Acute post-traumatic headache, not intractable 04/06/2018  . Cigarette nicotine dependence without complication 70/14/1030  . Epidermoid cyst of labia majora 02/06/2018  . Mass of axilla, left 02/04/2018  . Postmenopausal 02/02/2018  . History of colon polyps   . Cigarette smoker 09/03/2016  . Allergic rhinitis 09/04/2015  . BMI 39.0-39.9,adult 09/04/2015  . Hypertension associated with diabetes ([place]) 09/04/2015  . Hyperlipidemia 09/04/2015  . Menopausal symptom 09/04/2015  . Tobacco use 01/23/2013  . Type 2 diabetes mellitus without complication, without long-term current use of insulin ([place]) 04/26/2012  . Sciatica 04/22/2012    Morgan Garza Nilda Simmer PT, MPH  05/20/2018, 4:30 PM  [facility] [place], [place], [address] Phone: [phone]   Fax:  [phone]  Name: Morgan Garza MRN: [id number] Date of Birth: [birth year]

## 2018-05-20 NOTE — Patient Instructions (Addendum)
Reach for the floor and tip ear toward shoulder keeping chin tucked  10-15 sec hold x 3-5 reps  2-3 times a day   Resisted External Rotation: in Neutral - Bilateral   PALMS UP Sit or stand, tubing in both hands, elbows at sides, bent to 90, forearms forward. Pinch shoulder blades together and rotate forearms out. Keep elbows at sides. Repeat __10__ times per set. Do _2-3___ sets per session. Do _2-3___ sessions per day.   Low Row: Standing   Face anchor, feet shoulder width apart. Palms up, pull arms back, squeezing shoulder blades together. Repeat 10__ times per set. Do 2-3__ sets per session. Do 1-2_ sessions per day. Anchor Height: Waist   Strengthening: Resisted Extension   Hold tubing in right hand, arm forward. Pull arm back, elbow straight. Repeat _10___ times per set. Do 2-3____ sets per session. Do 2-3___ sessions per day.  Trigger Point Dry Needling  . What is Trigger Point Dry Needling (DN)? o DN is a physical therapy technique used to treat muscle pain and dysfunction. Specifically, DN helps deactivate muscle trigger points (muscle knots).  o A thin filiform needle is used to penetrate the skin and stimulate the underlying trigger point. The goal is for a local twitch response (LTR) to occur and for the trigger point to relax. No medication of any kind is injected during the procedure.   . What Does Trigger Point Dry Needling Feel Like?  o The procedure feels different for each individual patient. Some patients report that they do not actually feel the needle enter the skin and overall the process is not painful. Very mild bleeding may occur. However, many patients feel a deep cramping in the muscle in which the needle was inserted. This is the local twitch response.   Marland Kitchen How Will I feel after the treatment? o Soreness is normal, and the onset of soreness may not occur for a few hours. Typically this soreness does not last longer than two days.  o Bruising is  uncommon, however; ice can be used to decrease any possible bruising.  o In rare cases feeling tired or nauseous after the treatment is normal. In addition, your symptoms may get worse before they get better, this period will typically not last longer than 24 hours.   . What Can I do After My Treatment? o Increase your hydration by drinking more water for the next 24 hours. o You may place ice or heat on the areas treated that have become sore, however, do not use heat on inflamed or bruised areas. Heat often brings more relief post needling. o You can continue your regular activities, but vigorous activity is not recommended initially after the treatment for 24 hours. o DN is best combined with other physical therapy such as strengthening, stretching, and other therapies.

## 2018-05-30 ENCOUNTER — Encounter: Payer: Self-pay | Admitting: Rehabilitative and Restorative Service Providers"

## 2018-05-30 ENCOUNTER — Ambulatory Visit: Payer: Self-pay | Admitting: Rehabilitative and Restorative Service Providers"

## 2018-05-30 DIAGNOSIS — M542 Cervicalgia: Secondary | ICD-10-CM

## 2018-05-30 DIAGNOSIS — G44201 Tension-type headache, unspecified, intractable: Secondary | ICD-10-CM

## 2018-05-30 DIAGNOSIS — R293 Abnormal posture: Secondary | ICD-10-CM

## 2018-05-30 DIAGNOSIS — R29898 Other symptoms and signs involving the musculoskeletal system: Secondary | ICD-10-CM

## 2018-05-30 NOTE — Patient Instructions (Signed)
Scapular Retraction: Rowing (Eccentric) - Arms - 45 Degrees (Resistance Band)    Hold end of band in each hand. Pull back until elbows are even with trunk. Keep elbows out from sides at 45, thumbs up. Slowly release for 3-5 seconds. Use ____blue____ resistance band. _10__ reps per set, _2-3__ sets 1x/day    Bow and arrow - stepping back with one foot pulling band across chest - reps as above    Rolling small ball on back of hand with back to wall  20-30 sec 3-5 reps  1x/day   Arms straight up by ears resting on door facing - step through the door to feel the stretch in arms 30 sec hold x 3 reps

## 2018-05-30 NOTE — Therapy (Addendum)
[place], [place], [address] Phone: [phone]   Fax:  [phone]  Physical Therapy Treatment  Patient Details  Name: Morgan Garza MRN: [id number] Date of Birth: 12-02-1960 Referring Provider (PT): Dr Lynne Leader    Encounter Date: 05/30/2018  PT End of Session - 05/30/18 1538    Visit Number  4    Number of Visits  12    Date for PT Re-Evaluation  06/09/18    PT Start Time  1532    PT Stop Time  1600    PT Time Calculation (min)  28 min    Activity Tolerance  Patient tolerated treatment well       Past Medical History:  Diagnosis Date  . Diabetes mellitus without complication ([place])   . History of colon polyps   . Hyperlipidemia   . Hypertension     Past Surgical History:  Procedure Laterality Date  . NO PAST SURGERIES      There were no vitals filed for this visit.  Subjective Assessment - 05/30/18 1539    Subjective  patient reports no pain in the neck since soreness from needling subsides. She feels better and can tell her posture has improved. She is working on her HEP.     Currently in Pain?  No/denies         [facility] PT Assessment - 05/30/18 0001      Assessment   Medical Diagnosis  HA and neck pain     Referring Provider (PT)  Dr Lynne Leader     Onset Date/Surgical Date  03/19/19    Hand Dominance  Right    Next MD Visit  05/02/2018    Prior Therapy  for neck yrs ago       Observation/Other Assessments   Focus on Therapeutic Outcomes (FOTO)   17% limitation       AROM   AROM Assessment Site  --   no pain or discomfort with cervical ROM    Cervical Flexion  52    Cervical Extension  57    Cervical - Right Side Bend  29    Cervical - Left Side Bend  40    Cervical - Right Rotation  68    Cervical - Left Rotation  65      Palpation   Palpation comment  significant improvement in muscular tightness through the ant/lat/post cervical musculature; pecs; upper trap;  leveator; teres                    [facility] Adult PT Treatment/Exercise - 05/30/18 0001      Shoulder Exercises: Standing   Extension  Strengthening;Both;10 reps;Theraband    Theraband Level (Shoulder Extension)  Level 4 (Blue)    Row  Strengthening;Both;10 reps;Theraband    Theraband Level (Shoulder Row)  Level 4 (Blue)    Row Limitations  bow and arrow blue TB x 10 reps each side; row at 45 deg blue TB x 10      Retraction  Strengthening;Both;10 reps;Theraband    Theraband Level (Shoulder Retraction)  Level 3 (Green)    Other Standing Exercises  axial extension 10 sec x 5; scap squeeze 10 sec x 10; L's x10; W's x 10 with noodle     Other Standing Exercises  ball on wall dorsum of hand for posterior shoulder girdle strengthening 20-30 sec x 2 reps each side       Shoulder Exercises: Stretch  Other Shoulder Stretches  3 way doorway stretch 30 sec x 1 rep each position     Other Shoulder Stretches  shoulder flexion hands overhead on door facing 30 sec x 3 reps       Neck Exercises: Stretches   Upper Trapezius Stretch  Right;Left;2 reps;10 seconds   seated with chin tuck    Other Neck Stretches  cervical rotation x 2 each side to pt tolerance              PT Education - 05/30/18 1555    Education Details  HEP     Person(s) Educated  Patient    Methods  Explanation;Demonstration;Tactile cues;Verbal cues;Handout    Comprehension  Verbalized understanding;Returned demonstration;Verbal cues required;Tactile cues required          PT Long Term Goals - 05/30/18 1548      PT LONG TERM GOAL #1   Title  Improve posture and alignment with patient to demonstrate improed upright posture with posterior shoulder girdle engaged 06/09/2018    Time  6    Period  Weeks    Status  Achieved      PT LONG TERM GOAL #2   Title  Increase cervical extension and lateral cervical flexion by 5-10 deg in each direction without pain or discomfort 06/09/2018    Time  6    Period  Weeks     Status  Achieved      PT LONG TERM GOAL #3   Title  Decrease frequency, intensity and duration of headaches by 50-75% allowing patient to participate in normal household and work activities with less pain 06/09/2018    Time  6    Period  Weeks    Status  Achieved      PT LONG TERM GOAL #4   Title  Independent in HEP 06/09/2018    Time  6    Period  Weeks    Status  Achieved      PT LONG TERM GOAL #5   Title  Improve FOTO to </= 35% limitation 06/09/2018    Time  6    Period  Weeks    Status  Achieved            Plan - 05/30/18 1540    Clinical Impression Statement  No pain; significant improvememt in cervical ROM and palpable tightness. Progressing well with independent HEP. Good response to manual work and DN. Goals of PT accomplished. Patient will be placed on hold x 4 weeks and will call if she needs additional appointments     Rehab Potential  Good    PT Frequency  2x / week    PT Duration  6 weeks    PT Treatment/Interventions  Patient/family education;ADLs/Self Care Home Management;Cryotherapy;Electrical Stimulation;Iontophoresis 34m/ml Dexamethasone;Moist Heat;Ultrasound;Dry needling;Functional mobility training;Therapeutic activities;Therapeutic exercise;Manual lymph drainage;Neuromuscular re-education    PT Next Visit Plan  excellent progress - pt will call to schedule appointments as indicated     PT Home Exercise Plan   Access Code: [email or web address]    Consulted and Agree with Plan of Care  Patient       Patient will benefit from skilled therapeutic intervention in order to improve the following deficits and impairments:  Postural dysfunction, Improper body mechanics, Pain, Increased fascial restricitons, Increased muscle spasms, Hypomobility, Decreased range of motion, Decreased mobility, Decreased activity tolerance  Visit Diagnosis: Acute intractable tension-type headache  Cervicalgia  Abnormal posture  Other symptoms and signs involving the musculoskeletal  system  Problem List Patient Active Problem List   Diagnosis Date Noted  . Cervicalgia 04/06/2018  . Closed head injury with concussion 04/06/2018  . Fall (on) (from) other stairs and steps, initial encounter 04/06/2018  . Acute post-traumatic headache, not intractable 04/06/2018  . Cigarette nicotine dependence without complication 69/48/5462  . Epidermoid cyst of labia majora 02/06/2018  . Mass of axilla, left 02/04/2018  . Postmenopausal 02/02/2018  . History of colon polyps   . Cigarette smoker 09/03/2016  . Allergic rhinitis 09/04/2015  . BMI 39.0-39.9,adult 09/04/2015  . Hypertension associated with diabetes ([place]) 09/04/2015  . Hyperlipidemia 09/04/2015  . Menopausal symptom 09/04/2015  . Tobacco use 01/23/2013  . Type 2 diabetes mellitus without complication, without long-term current use of insulin ([place]) 04/26/2012  . Sciatica 04/22/2012    Caycee Wanat Nilda Simmer PT, MPH  05/30/2018, 5:02 PM  [facility] [place], [place], [address] Phone: [phone]   Fax:  [phone]  Name: Morgan Garza MRN: [id number] Date of Birth: [birth year]  PHYSICAL THERAPY DISCHARGE SUMMARY  Visits from Start of Care: 4  Current functional level related to goals / functional outcomes: See progress note for discharge status    Remaining deficits: No known deficits    Education / Equipment: HEP  Plan: Patient agrees to discharge.  Patient goals were met. Patient is being discharged due to meeting the stated rehab goals.  ?????    [place], PT, MPH  Acute Rehab [phone]

## 2018-06-09 ENCOUNTER — Ambulatory Visit: Payer: Self-pay | Admitting: Family Medicine

## 2018-06-09 ENCOUNTER — Other Ambulatory Visit: Payer: Self-pay

## 2018-06-09 ENCOUNTER — Encounter: Payer: Self-pay | Admitting: Family Medicine

## 2018-06-09 VITALS — BP 171/93 | HR 77 | Ht 68.0 in | Wt 263.0 lb

## 2018-06-09 DIAGNOSIS — Z124 Encounter for screening for malignant neoplasm of cervix: Secondary | ICD-10-CM

## 2018-06-09 DIAGNOSIS — Z1151 Encounter for screening for human papillomavirus (HPV): Secondary | ICD-10-CM

## 2018-06-09 DIAGNOSIS — N907 Vulvar cyst: Secondary | ICD-10-CM

## 2018-06-09 NOTE — Assessment & Plan Note (Signed)
Would not recommend treatment if this is not bothering her.

## 2018-06-09 NOTE — Progress Notes (Signed)
   Subjective:    Patient ID: Morgan Garza is a 58 y.o. female presenting with No chief complaint on file.  on 06/09/2018  HPI: Referred for "skin tags". Notes they seem to be getting bigger. Denies that they are irritating or itchy. Just feels them when bathing. Notes that they have been there about 1 year. Has been increasing in number as well.  Review of Systems  Constitutional: Negative for chills and fever.  Respiratory: Negative for shortness of breath.   Cardiovascular: Negative for chest pain.  Gastrointestinal: Negative for abdominal pain, nausea and vomiting.  Genitourinary: Negative for dysuria.  Skin: Negative for rash.      Objective:    BP (!) 171/93   Pulse 77   Ht 5\' 8"  (1.727 m)   Wt 263 lb (119.3 kg)   LMP 11/09/2010   BMI 39.99 kg/m  Physical Exam Constitutional:      General: She is not in acute distress.    Appearance: She is well-developed.  HENT:     Head: Normocephalic and atraumatic.  Eyes:     General: No scleral icterus. Neck:     Musculoskeletal: Neck supple.  Cardiovascular:     Rate and Rhythm: Normal rate.  Pulmonary:     Effort: Pulmonary effort is normal.  Abdominal:     Palpations: Abdomen is soft.  Genitourinary:    Comments: Vulva with a few flesh colored inclusions consistent with sebum, or clogged pore. BUS normal, vagina is pink and rugated, cervix is parous without lesion, uterus is small and anteverted, no adnexal mass or tenderness.  Skin:    General: Skin is warm and dry.  Neurological:     Mental Status: She is alert and oriented to person, place, and time.         Assessment & Plan:   Problem List Items Addressed This Visit      Unprioritized   Sebaceous cyst of labia    Would not recommend treatment if this is not bothering her.       Other Visit Diagnoses    Screening for cervical cancer    -  Primary   Relevant Orders   Cytology - PAP( [place])     Total face-to-face time with patient: 20  minutes. Over 50% of encounter was spent on counseling and coordination of care. Return if symptoms worsen or fail to improve.  Donnamae Jude 06/09/2018 1:34 PM

## 2018-06-13 LAB — CYTOLOGY - PAP
Diagnosis: NEGATIVE
HPV: NOT DETECTED

## 2018-08-25 ENCOUNTER — Other Ambulatory Visit: Payer: Self-pay

## 2018-08-25 ENCOUNTER — Emergency Department
Admission: EM | Admit: 2018-08-25 | Discharge: 2018-08-25 | Disposition: A | Discharge status: Home / Self Care | Payer: Self-pay | Source: Home / Self Care

## 2018-08-25 ENCOUNTER — Encounter: Payer: Self-pay | Admitting: Emergency Medicine

## 2018-08-25 DIAGNOSIS — S39012A Strain of muscle, fascia and tendon of lower back, initial encounter: Secondary | ICD-10-CM

## 2018-08-25 MED ORDER — CYCLOBENZAPRINE HCL 10 MG PO TABS: 10.0000 mg | ORAL_TABLET | Freq: Three times a day (TID) | ORAL | 0 refills | Status: DC | PRN

## 2018-08-25 MED ORDER — HYDROCODONE-ACETAMINOPHEN 5-325 MG PO TABS: 1.0000 | ORAL_TABLET | Freq: Four times a day (QID) | ORAL | 0 refills | Status: DC | PRN

## 2018-08-25 MED ORDER — KETOROLAC TROMETHAMINE 60 MG/2ML IM SOLN
60.0000 mg | Freq: Once | INTRAMUSCULAR | Status: AC
  Administered 2018-08-25: 60 mg via INTRAMUSCULAR

## 2018-08-25 NOTE — ED Provider Notes (Signed)
[facility]    CSN: [id number] Arrival date & time: 08/25/18  0804     History   Chief Complaint Chief Complaint  Patient presents with  . Back Injury    HPI Morgan Garza is a 58 y.o. female.   While at work last night, patient reached down to pick up her purse that was on a chair, and felt sudden sharp pain in her left lower back.  The now involves her entire lower back, worse on the left, but does not radiate.   She denies bowel or bladder dysfunction, and no saddle numbness.  The pain is worse when standing.  The history is provided by the patient.  Back Pain  Location:  Lumbar spine Quality:  Aching and stabbing Radiates to:  Does not radiate Pain severity:  Severe Pain is:  Same all the time Onset quality:  Sudden Duration:  9 hours Timing:  Constant Progression:  Worsening Chronicity:  New Context comment:  Occurred while bending over Relieved by:  Nothing Worsened by:  Bending, movement, twisting, standing and ambulation Ineffective treatments:  None tried Associated symptoms: no abdominal pain, no abdominal swelling, no bladder incontinence, no bowel incontinence, no chest pain, no dysuria, no fever, no leg pain, no numbness, no paresthesias, no pelvic pain, no perianal numbness, no tingling and no weakness   Risk factors: menopause and obesity     Past Medical History:  Diagnosis Date  . Diabetes mellitus without complication ([place])   . History of colon polyps   . Hyperlipidemia   . Hypertension     Patient Active Problem List   Diagnosis Date Noted  . Cervicalgia 04/06/2018  . Closed head injury with concussion 04/06/2018  . Fall (on) (from) other stairs and steps, initial encounter 04/06/2018  . Acute post-traumatic headache, not intractable 04/06/2018  . Cigarette nicotine dependence without complication 61/44/3154  . Sebaceous cyst of labia 02/06/2018  . Mass of axilla, left 02/04/2018  . Postmenopausal 02/02/2018  . History of  colon polyps   . Cigarette smoker 09/03/2016  . Allergic rhinitis 09/04/2015  . BMI 39.0-39.9,adult 09/04/2015  . Hypertension associated with diabetes ([place]) 09/04/2015  . Hyperlipidemia 09/04/2015  . Menopausal symptom 09/04/2015  . Tobacco use 01/23/2013  . Type 2 diabetes mellitus without complication, without long-term current use of insulin ([place]) 04/26/2012  . Sciatica 04/22/2012    Past Surgical History:  Procedure Laterality Date  . NO PAST SURGERIES      OB History    Gravida  6   Para  4   Term      Preterm      AB  2   Living  4     SAB  1   TAB  1   Ectopic      Multiple      Live Births               Home Medications    Prior to Admission medications   Medication Sig Start Date End Date Taking? Authorizing Provider  amLODipine (NORVASC) 10 MG tablet Take 1 tablet (10 mg total) by mouth every morning. 02/27/18   Trixie Dredge, PA-C  atorvastatin (LIPITOR) 20 MG tablet Take 1 tablet (20 mg total) by mouth at bedtime. 02/27/18   Trixie Dredge, PA-C  Cholecalciferol (VITAMIN D3) 5000 units TABS Take by mouth.    [provider]  cyclobenzaprine (FLEXERIL) 10 MG tablet Take 1 tablet (10 mg total) by mouth 3 (three) times  daily as needed for muscle spasms. 08/25/18   Kandra Nicolas, MD  glipiZIDE (GLUCOTROL XL) 10 MG 24 hr tablet Take 1 tablet (10 mg total) by mouth daily before breakfast. 02/27/18   Trixie Dredge, PA-C  glucose blood (PRECISION QID TEST) test strip Use one strip to test blood sugar. 09/03/16   [provider]  HYDROcodone-acetaminophen (NORCO/VICODIN) 5-325 MG tablet Take 1 tablet by mouth every 6 (six) hours as needed for moderate pain or severe pain. 08/25/18   Kandra Nicolas, MD  ibuprofen (ADVIL,MOTRIN) 800 MG tablet Take by mouth. 01/05/13   [provider]  lisinopril-hydrochlorothiazide (PRINZIDE,ZESTORETIC) 20-25 MG tablet Take 1 tablet by mouth every morning.  02/27/18   Trixie Dredge, PA-C  metFORMIN (GLUCOPHAGE-XR) 500 MG 24 hr tablet Take 2 tablets (1,000 mg total) by mouth daily with supper. 02/27/18   Trixie Dredge, PA-C  nicotine (NICODERM CQ) 14 mg/24hr patch Place 1 patch (14 mg total) onto the skin daily. 02/02/18   Trixie Dredge, PA-C    Family History Family History  Problem Relation Age of Onset  . Hypertension Mother   . COPD Mother   . Cancer Father   . Gastric cancer Father   . Testicular cancer Son   . Diabetes Son   . Diabetes Daughter     Social History Social History   Tobacco Use  . Smoking status: Current Every Day Smoker    Packs/day: 0.50    Years: 1.00    Pack years: 0.50    Types: Cigarettes    Last attempt to quit: 03/30/2018    Years since quitting: 0.4  . Smokeless tobacco: Never Used  Substance Use Topics  . Alcohol use: Not Currently    Frequency: Never  . Drug use: Never     Allergies   Bupropion and Varenicline   Review of Systems Review of Systems  Constitutional: Negative for chills, diaphoresis, fatigue and fever.  Cardiovascular: Negative for chest pain.  Gastrointestinal: Negative for abdominal pain and bowel incontinence.  Genitourinary: Negative for bladder incontinence, dysuria and pelvic pain.  Musculoskeletal: Positive for back pain.  Skin: Negative for rash.  Neurological: Negative for tingling, weakness, numbness and paresthesias.  All other systems reviewed and are negative.    Physical Exam Triage Vital Signs ED Triage Vitals  Enc Vitals Group     BP 08/25/18 0917 (!) 160/87     Pulse Rate 08/25/18 0917 61     Resp --      Temp 08/25/18 0917 98.1 F (36.7 C)     Temp Source 08/25/18 0917 Oral     SpO2 08/25/18 0917 99 %     Weight 08/25/18 0918 269 lb (122 kg)     Height 08/25/18 0918 5\' 9"  (1.753 m)     Head Circumference --      Peak Flow --      Pain Score 08/25/18 0918 10     Pain Loc --      Pain Edu? --      Excl.  in [place]? --    No data found.  Updated Vital Signs BP (!) 160/87 (BP Location: Right Arm)   Pulse 61   Temp 98.1 F (36.7 C) (Oral)   Ht 5\' 9"  (1.753 m)   Wt 122 kg   LMP 11/09/2010   SpO2 99%   BMI 39.72 kg/m   Visual Acuity Right Eye Distance:   Left Eye Distance:   Bilateral Distance:  Right Eye Near:   Left Eye Near:    Bilateral Near:     Physical Exam Vitals signs and nursing note reviewed.  Constitutional:      General: She is not in acute distress. HENT:     Head: Normocephalic.     Right Ear: External ear normal.     Left Ear: External ear normal.     Nose: Nose normal.     Mouth/Throat:     Mouth: Mucous membranes are moist.  Eyes:     Conjunctiva/sclera: Conjunctivae normal.     Pupils: Pupils are equal, round, and reactive to light.  Neck:     Musculoskeletal: Normal range of motion.  Cardiovascular:     Rate and Rhythm: Normal rate.  Pulmonary:     Effort: Pulmonary effort is normal.  Musculoskeletal:     Lumbar back: She exhibits decreased range of motion and tenderness. She exhibits no bony tenderness, no swelling and no edema.       Back:     Comments: Back:  Range of motion decreased.  Can heel/toe walk and squat without difficulty. Tenderness in the midline and bilateral paraspinous muscles from L2 to Sacral area.  Straight leg raising test is negative.  Sitting knee extension test is negative.  Strength and sensation in the lower extremities is normal.  Patellar and achilles reflexes are normal   Skin:    General: Skin is warm and dry.     Findings: No rash.  Neurological:     Mental Status: She is alert.      UC Treatments / Results  Labs (all labs ordered are listed, but only abnormal results are displayed) Labs Reviewed - No data to display  EKG None  Radiology No results found.  Procedures Procedures (including critical care time)  Medications Ordered in UC Medications  ketorolac (TORADOL) injection 60 mg (has no  administration in time range)    Initial Impression / Assessment and Plan / UC Course  I have reviewed the triage vital signs and the nursing notes.  Pertinent labs & imaging results that were available during my care of the patient were reviewed by me and considered in my medical decision making (see chart for details).    Administered Toradol 60mg  IM. Rx for Flexeril, and Lortab (#15, no refill). Controlled Substance Prescriptions I have consulted the [place] Controlled Substances Registry for this patient, and feel the risk/benefit ratio today is favorable for proceeding with this prescription for a controlled substance.   Followup with Dr. Aundria Mems or Dr. Lynne Leader ([facility]) if not improving about two weeks.    Final Clinical Impressions(s) / UC Diagnoses   Final diagnoses:  Low back strain, initial encounter     Discharge Instructions     Apply ice pack for 20 to 30 minutes, 3 to 4 times daily  Continue until pain and swelling decrease.  May take Ibuprofen 200mg , 4 tabs every 8 hours with food.  Begin back range of motion and stretching exercises.as tolerated     ED Prescriptions    Medication Sig Dispense Auth. Provider   cyclobenzaprine (FLEXERIL) 10 MG tablet Take 1 tablet (10 mg total) by mouth 3 (three) times daily as needed for muscle spasms. 30 tablet Kandra Nicolas, MD   HYDROcodone-acetaminophen (NORCO/VICODIN) 5-325 MG tablet Take 1 tablet by mouth every 6 (six) hours as needed for moderate pain or severe pain. 15 tablet Kandra Nicolas, MD  Kandra Nicolas, MD 08/25/18 [phone]

## 2018-08-25 NOTE — Discharge Instructions (Addendum)
Apply ice pack for 20 to 30 minutes, 3 to 4 times daily  Continue until pain and swelling decrease.  May take Ibuprofen 200mg , 4 tabs every 8 hours with food.  Begin back range of motion and stretching exercises.as tolerated

## 2018-08-29 ENCOUNTER — Ambulatory Visit: Payer: Self-pay | Admitting: Family Medicine

## 2018-08-29 ENCOUNTER — Telehealth: Payer: Self-pay | Admitting: Family Medicine

## 2018-08-29 MED ORDER — GABAPENTIN 300 MG PO CAPS: ORAL_CAPSULE | ORAL | 3 refills | Status: DC

## 2018-08-29 MED ORDER — HYDROCODONE-ACETAMINOPHEN 5-325 MG PO TABS: 1.0000 | ORAL_TABLET | Freq: Four times a day (QID) | ORAL | 0 refills | Status: DC | PRN

## 2018-08-29 MED ORDER — PREDNISONE 50 MG PO TABS: 50.0000 mg | ORAL_TABLET | Freq: Every day | ORAL | 0 refills | Status: DC

## 2018-08-29 NOTE — Telephone Encounter (Signed)
Left message for pt to return call to schedule appt

## 2018-08-29 NOTE — Telephone Encounter (Signed)
Patient had phone call visit with me today.  Developed worsening pain in neck and back last week.  Tried to return to work after a few days and the pain returned again.  Having pain radiating down arm. Muscles are in spasm. Feels very tight.  Pain radiates into finger into the C7 distribution.  No severe weakness.   Patient will be seen in clinic in person tomorrow. Plan to prescribe hydrocodone, prednisone, gabapentin.  PDMP reviewed during this encounter.

## 2018-08-29 NOTE — Telephone Encounter (Signed)
Pt called and stated she was seen at Urgent care for back pain and nerve pain in arm. Pt states she is doing evertything they told her to do. Tens unit, ice, flexeril and hydrocodone.  She stated she is out of hydrocodone and is requesting a couple more days worth. Pt uses [employer] [facility].

## 2018-08-29 NOTE — Telephone Encounter (Signed)
Please schedule video visit with me for in person visit with me today or tomorrow.

## 2018-08-30 ENCOUNTER — Encounter: Payer: Self-pay | Admitting: Family Medicine

## 2018-08-30 ENCOUNTER — Ambulatory Visit (INDEPENDENT_AMBULATORY_CARE_PROVIDER_SITE_OTHER): Payer: Self-pay | Admitting: Family Medicine

## 2018-08-30 VITALS — BP 158/88 | HR 94 | Temp 98.4°F | Wt 267.0 lb

## 2018-08-30 DIAGNOSIS — M542 Cervicalgia: Secondary | ICD-10-CM

## 2018-08-30 DIAGNOSIS — M5412 Radiculopathy, cervical region: Secondary | ICD-10-CM

## 2018-08-30 NOTE — Progress Notes (Signed)
Morgan Garza is a 58 y.o. female who presents to [facility] today for neck pain radiating down her arm.  Patient notes history of neck pain worsening recently radiating down her right arm to the second and third digits.  She had an episode where she had a low back strain and was seen and the urgent care or emergency room on May 28.  She was prescribed some hydrocodone.  She notes her low back pain resolved but she notes worsening pain radiating down her right arm.  She feels a little bit of weakness as well for example she is dropped some objects when carrying them in her hand.  She notes that the hydrocodone that was prescribed for her back pain did help her hand pain.  She had a phone call visit with me yesterday and I prescribed hydrocodone prednisone gabapentin and scheduled her for follow-up with me today. She notes considerable pain radiating down her right arm.  She notes the pain is located in the anterior upper arm and into the mid forearm.  She has some pain into some of the fingers of her hand but is she is not exactly sure.  Symptoms are severe.  She denies severe weakness but does note some mild weakness as noted above.   ROS:  As above  Exam:  BP (!) 158/88   Pulse 94   Temp 98.4 F (36.9 C) (Oral)   Wt 267 lb (121.1 kg)   LMP 11/09/2010   BMI 39.43 kg/m  Wt Readings from Last 5 Encounters:  08/30/18 267 lb (121.1 kg)  08/25/18 269 lb (122 kg)  06/09/18 263 lb (119.3 kg)  04/15/18 267 lb (121.1 kg)  04/01/18 258 lb 11.2 oz (117.3 kg)   General: Well Developed, well nourished, and in no acute distress.  Neuro/Psych: Alert and oriented x3, extra-ocular muscles intact, able to move all 4 extremities, sensation grossly intact. Skin: Warm and dry, no rashes noted.  Respiratory: Not using accessory muscles, speaking in full sentences, trachea midline.  Cardiovascular: Pulses palpable, no extremity edema. Abdomen: Does not  appear distended. MSK:  C-spine: Nontender to spinal midline.  Tender palpation right cervical paraspinal musculature.  Decreased cervical motion with pain.  Positive right-sided Spurling's test. Upper extremity strength is diminished to elbow extension and mild elbow flexion.  Mild grip strength reduction as well. Sensation is intact throughout. Pulses cap refill and sensation are intact distally.  Right shoulder.  Normal.  Not particular tender normal motion.  Pain relieved with overhead arm position.      Lab and Radiology Results EXAM: CERVICAL SPINE - COMPLETE 4+ VIEW  COMPARISON:  None.  FINDINGS: No fracture or dislocation. Straightening of the normal lordosis. Mild narrowing of C5-6 and C6-7 interspaces. Vertebral endplate spurring N9-G9. No prevertebral soft tissue swelling. Foraminal encroachment secondary to uncovertebral spurring C4-5, C5-6, C6-7. Missing dentition and dental restorations.  IMPRESSION: 1. Negative for fracture or other acute bone abnormality. 2. Degenerative changes C4-C7 as above. 3. Loss of the normal cervical spine lordosis, which may be secondary to positioning, spasm, or soft tissue injury.   Electronically Signed   By: Lucrezia Europe M.D.   On: 04/01/2018 16:28  I personally (independently) visualized and performed the interpretation of the images attached in this note.     Assessment and Plan: 58 y.o. female with right cervical radiculopathy and neck pain.  Likely C5 nerve root possibly C7 or T1.  Distribution is difficult to tell as  her symptoms are somewhat broad.  She does have degenerative changes on x-ray from earlier this year in those regions however. She already is taking prednisone gabapentin and hydrocodone but is very symptomatic.  Plan to proceed with MRI later this week.  She can cancel MRI if she improves with prednisone.  Precautions reviewed.  Recheck sooner if needed.   PDMP reviewed during this encounter. Orders  Placed This Encounter  Procedures  . MR Cervical Spine Wo Contrast    Standing Status:   Future    Standing Expiration Date:   10/30/2019    Order Specific Question:   What is the patient's sedation requirement?    Answer:   No Sedation    Order Specific Question:   Does the patient have a pacemaker or implanted devices?    Answer:   No    Order Specific Question:   Preferred imaging location?    Answer:   Product/process development scientist (table limit-350lbs)    Order Specific Question:   Radiology Contrast Protocol - do NOT remove file path    Answer:   [email or web address]   No orders of the defined types were placed in this encounter.   Historical information moved to improve visibility of documentation.  Past Medical History:  Diagnosis Date  . Diabetes mellitus without complication ([place])   . History of colon polyps   . Hyperlipidemia   . Hypertension    Past Surgical History:  Procedure Laterality Date  . NO PAST SURGERIES     Social History   Tobacco Use  . Smoking status: Current Every Day Smoker    Packs/day: 0.50    Years: 1.00    Pack years: 0.50    Types: Cigarettes    Last attempt to quit: 03/30/2018    Years since quitting: 0.4  . Smokeless tobacco: Never Used  Substance Use Topics  . Alcohol use: Not Currently    Frequency: Never   family history includes COPD in her mother; Cancer in her father; Diabetes in her daughter and son; Gastric cancer in her father; Hypertension in her mother; Testicular cancer in her son.  Medications: Current Outpatient Medications  Medication Sig Dispense Refill  . amLODipine (NORVASC) 10 MG tablet Take 1 tablet (10 mg total) by mouth every morning. 90 tablet 1  . atorvastatin (LIPITOR) 20 MG tablet Take 1 tablet (20 mg total) by mouth at bedtime. 90 tablet 1  . Cholecalciferol (VITAMIN D3) 5000 units TABS Take by mouth.    . cyclobenzaprine (FLEXERIL) 10 MG tablet Take 1 tablet (10 mg total) by mouth 3 (three)  times daily as needed for muscle spasms. 30 tablet 0  . gabapentin (NEURONTIN) 300 MG capsule One tab PO qHS for a week, then BID for a week, then TID. May double weekly to a max of 3,600mg /day 180 capsule 3  . glipiZIDE (GLUCOTROL XL) 10 MG 24 hr tablet Take 1 tablet (10 mg total) by mouth daily before breakfast. 90 tablet 1  . glucose blood (PRECISION QID TEST) test strip Use one strip to test blood sugar.    . HYDROcodone-acetaminophen (NORCO/VICODIN) 5-325 MG tablet Take 1 tablet by mouth every 6 (six) hours as needed for moderate pain or severe pain. 15 tablet 0  . ibuprofen (ADVIL,MOTRIN) 800 MG tablet Take by mouth.    Marland Kitchen lisinopril-hydrochlorothiazide (PRINZIDE,ZESTORETIC) 20-25 MG tablet Take 1 tablet by mouth every morning. 90 tablet 1  . metFORMIN (GLUCOPHAGE-XR) 500 MG 24 hr tablet Take 2 tablets (1,000 mg total)  by mouth daily with supper. 180 tablet 1  . nicotine (NICODERM CQ) 14 mg/24hr patch Place 1 patch (14 mg total) onto the skin daily. 28 patch 5  . predniSONE (DELTASONE) 50 MG tablet Take 1 tablet (50 mg total) by mouth daily. 5 tablet 0   No current facility-administered medications for this visit.    Allergies  Allergen Reactions  . Bupropion Hives       . Varenicline Other (See Comments)    Vision changes, "everything was enhanced"      Discussed warning signs or symptoms. Please see discharge instructions. Patient expresses understanding.

## 2018-08-30 NOTE — Patient Instructions (Signed)
Thank you for coming in today. Continue medicines.  At check out go to radiology downstairs and schedule MRI.  Cancel MRI if you get better.   Keep me updated.    Cervical Radiculopathy  Cervical radiculopathy happens when a nerve in the neck (cervical nerve) is pinched or bruised. This condition can develop because of an injury or as part of the normal aging process. Pressure on the cervical nerves can cause pain or numbness that runs from the neck all the way down into the arm and fingers. Usually, this condition gets better with rest. Treatment may be needed if the condition does not improve. What are the causes? This condition may be caused by:  Injury.  Slipped (herniated) disk.  Muscle tightness in the neck because of overuse.  Arthritis.  Breakdown or degeneration in the bones and joints of the spine (spondylosis) due to aging.  Bone spurs that may develop near the cervical nerves. What are the signs or symptoms? Symptoms of this condition include:  Pain that runs from the neck to the arm and hand. The pain can be severe or irritating. It may be worse when the neck is moved.  Numbness or weakness in the affected arm and hand. How is this diagnosed? This condition may be diagnosed based on symptoms, medical history, and a physical exam. You may also have tests, including:  X-rays.  CT scan.  MRI.  Electromyogram (EMG).  Nerve conduction tests. How is this treated? In many cases, treatment is not needed for this condition. With rest, the condition usually gets better over time. If treatment is needed, options may include:  Wearing a soft neck collar for short periods of time.  Physical therapy to strengthen your neck muscles.  Medicines, such as NSAIDs, oral corticosteroids, or spinal injections.  Surgery. This may be needed if other treatments do not help. Various types of surgery may be done depending on the cause of your problems. Follow these instructions  at home: Managing pain  Take over-the-counter and prescription medicines only as told by your health care provider.  If directed, apply ice to the affected area. ? Put ice in a plastic bag. ? Place a towel between your skin and the bag. ? Leave the ice on for 20 minutes, 2-3 times per day.  If ice does not help, you can try using heat. Take a warm shower or warm bath, or use a heat pack as told by your health care provider.  Try a gentle neck and shoulder massage to help relieve symptoms. Activity  Rest as needed. Follow instructions from your health care provider about any restrictions on activities.  Do stretching and strengthening exercises as told by your health care provider or physical therapist. General instructions  If you were given a soft collar, wear it as told by your health care provider.  Use a flat pillow when you sleep.  Keep all follow-up visits as told by your health care provider. This is important. Contact a health care provider if:  Your condition does not improve with treatment. Get help right away if:  Your pain gets much worse and cannot be controlled with medicines.  You have weakness or numbness in your hand, arm, face, or leg.  You have a high fever.  You have a stiff, rigid neck.  You lose control of your bowels or your bladder (have incontinence).  You have trouble with walking, balance, or speaking. This information is not intended to replace advice given to you  by your health care provider. Make sure you discuss any questions you have with your health care provider. Document Released: 12/09/2000 Document Revised: 08/22/2015 Document Reviewed: 05/10/2014 Elsevier Interactive Patient Education  [employer].

## 2018-09-04 ENCOUNTER — Other Ambulatory Visit: Payer: Self-pay

## 2018-09-04 ENCOUNTER — Ambulatory Visit (INDEPENDENT_AMBULATORY_CARE_PROVIDER_SITE_OTHER): Payer: Self-pay

## 2018-09-04 DIAGNOSIS — M542 Cervicalgia: Secondary | ICD-10-CM

## 2018-09-04 DIAGNOSIS — M5412 Radiculopathy, cervical region: Secondary | ICD-10-CM

## 2018-09-05 ENCOUNTER — Ambulatory Visit: Payer: Self-pay | Admitting: Family Medicine

## 2018-09-05 ENCOUNTER — Telehealth: Payer: Self-pay | Admitting: Family Medicine

## 2018-09-05 DIAGNOSIS — M5412 Radiculopathy, cervical region: Secondary | ICD-10-CM

## 2018-09-05 NOTE — Telephone Encounter (Signed)
Called Morgan Garza to schedule and called pt and left msg advising of epidural ordered

## 2018-09-05 NOTE — Telephone Encounter (Signed)
Epidural steroid injection ordered.  Proceed to injection if not improving with conservative measures.

## 2018-09-07 ENCOUNTER — Encounter: Payer: Self-pay | Admitting: Family Medicine

## 2018-09-07 DIAGNOSIS — M5412 Radiculopathy, cervical region: Secondary | ICD-10-CM

## 2018-09-07 MED ORDER — HYDROCODONE-ACETAMINOPHEN 5-325 MG PO TABS: 1.0000 | ORAL_TABLET | Freq: Four times a day (QID) | ORAL | 0 refills | Status: DC | PRN

## 2018-09-08 NOTE — Addendum Note (Signed)
Addended by: Gregor Hams on: 09/08/2018 07:51 AM   Modules accepted: Orders

## 2018-09-22 ENCOUNTER — Telehealth: Payer: Self-pay | Admitting: Family Medicine

## 2018-09-22 ENCOUNTER — Other Ambulatory Visit: Payer: Self-pay | Admitting: Family Medicine

## 2018-09-22 NOTE — Telephone Encounter (Signed)
Pt called and stated her appt. With the spine center with Dr. Francesco Runner is scheduled forJuly 20th, and she is also on waiting list for cancellations. Pt states she is in a lot of pain and is requesting to go back on steriods and get a refill of vicoden to get her through until appt. Pt's pharmacy verified [facility] in [place].. Pt can be reach at [phone]

## 2018-09-23 MED ORDER — PREDNISONE 5 MG (48) PO TBPK: ORAL_TABLET | ORAL | 0 refills | Status: DC

## 2018-09-23 MED ORDER — HYDROCODONE-ACETAMINOPHEN 5-325 MG PO TABS: 1.0000 | ORAL_TABLET | Freq: Four times a day (QID) | ORAL | 0 refills | Status: DC | PRN

## 2018-09-23 NOTE — Telephone Encounter (Signed)
Patient would like to know who at [facility] you had discussed with her seeing as she would like to get in touch with them just to call and see. Please advise

## 2018-09-23 NOTE — Telephone Encounter (Signed)
Medicine sent to pharmacy. Will use longer tapering course of prednisone.

## 2018-09-26 ENCOUNTER — Other Ambulatory Visit: Payer: Self-pay

## 2018-09-26 NOTE — Telephone Encounter (Signed)
Dr  Kristian Covey at [facility]

## 2018-09-26 NOTE — Telephone Encounter (Signed)
Left pt msg advising of providers name

## 2018-10-28 ENCOUNTER — Telehealth: Payer: Self-pay

## 2018-10-28 DIAGNOSIS — Z20828 Contact with and (suspected) exposure to other viral communicable diseases: Secondary | ICD-10-CM

## 2018-10-28 NOTE — Telephone Encounter (Signed)
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  She has been self quarantining.  She wants to be tested. Please advise. -EH/RMA

## 2018-10-28 NOTE — Telephone Encounter (Signed)
Sent patient MyChart message with testing options Order is placed for testing site

## 2018-11-01 ENCOUNTER — Telehealth: Payer: Self-pay | Admitting: Physician Assistant

## 2018-11-01 ENCOUNTER — Encounter: Payer: Self-pay | Admitting: Physician Assistant

## 2018-11-01 DIAGNOSIS — E1159 Type 2 diabetes mellitus with other circulatory complications: Secondary | ICD-10-CM

## 2018-11-01 DIAGNOSIS — I152 Hypertension secondary to endocrine disorders: Secondary | ICD-10-CM

## 2018-11-01 DIAGNOSIS — E119 Type 2 diabetes mellitus without complications: Secondary | ICD-10-CM

## 2018-11-01 MED ORDER — AMLODIPINE BESYLATE 10 MG PO TABS: 10.0000 mg | ORAL_TABLET | ORAL | 1 refills | Status: DC

## 2018-11-01 MED ORDER — METFORMIN HCL ER 500 MG PO TB24: 1000.0000 mg | ORAL_TABLET | Freq: Every day | ORAL | 1 refills | Status: DC

## 2018-11-01 MED ORDER — LISINOPRIL-HYDROCHLOROTHIAZIDE 20-25 MG PO TABS: 1.0000 | ORAL_TABLET | ORAL | 1 refills | Status: DC

## 2018-11-01 MED ORDER — GLIPIZIDE ER 10 MG PO TB24: 10.0000 mg | ORAL_TABLET | Freq: Every day | ORAL | 1 refills | Status: DC

## 2018-11-01 MED ORDER — ATORVASTATIN CALCIUM 20 MG PO TABS: 20.0000 mg | ORAL_TABLET | Freq: Every day | ORAL | 1 refills | Status: DC

## 2018-11-01 NOTE — Telephone Encounter (Signed)
Routine meds filled. [facility] LPN

## 2019-01-04 ENCOUNTER — Encounter (INDEPENDENT_AMBULATORY_CARE_PROVIDER_SITE_OTHER): Payer: Self-pay

## 2019-01-04 ENCOUNTER — Encounter: Payer: Self-pay | Admitting: Family Medicine

## 2019-01-04 ENCOUNTER — Ambulatory Visit (INDEPENDENT_AMBULATORY_CARE_PROVIDER_SITE_OTHER): Payer: Self-pay | Admitting: Family Medicine

## 2019-01-04 ENCOUNTER — Telehealth: Payer: Self-pay

## 2019-01-04 VITALS — HR 77 | Temp 99.7°F | Wt 261.0 lb

## 2019-01-04 DIAGNOSIS — R059 Cough, unspecified: Secondary | ICD-10-CM

## 2019-01-04 DIAGNOSIS — Z20822 Contact with and (suspected) exposure to covid-19: Secondary | ICD-10-CM

## 2019-01-04 DIAGNOSIS — Z20828 Contact with and (suspected) exposure to other viral communicable diseases: Secondary | ICD-10-CM

## 2019-01-04 DIAGNOSIS — R0602 Shortness of breath: Secondary | ICD-10-CM

## 2019-01-04 DIAGNOSIS — R05 Cough: Secondary | ICD-10-CM

## 2019-01-04 MED ORDER — PREDNISONE 10 MG PO TABS: 30.0000 mg | ORAL_TABLET | Freq: Every day | ORAL | 0 refills | Status: DC

## 2019-01-04 MED ORDER — AZITHROMYCIN 250 MG PO TABS: 250.0000 mg | ORAL_TABLET | Freq: Every day | ORAL | 0 refills | Status: DC

## 2019-01-04 NOTE — Telephone Encounter (Signed)
Patient advised on appetite and weakness per protocol:  If appetite becomes worse: encourage patient to drink fluids as tolerated, work their way up to bland solid food such as crackers, pretzels, soup, bread or applesauce and boiled starches.   If patient is unable to tolerate any foods or liquids, notify PCP.   IF PATIENT DEVELOPS SEVERE VOMITING (MORE THAN 6 TIMES A DAY AND OR >8 HOURS) AND/OR SEVERE ABDOMINAL PAIN ADVISE PATIENT TO CALL 911 AND SEEK TREATMENT IN ED   IF PATIENT HAS WORSENING WEAKNESS WITH INABILITY TO STAND OR IF PATIENT HAS TO HOLD ON TO SOMETHING TO GET BALANCE, ADVISE PATIENT TO CALL 911 AND SEEK TREATMENT IN ED  Patient verbalized understanding and agrees with the plan.

## 2019-01-04 NOTE — Patient Instructions (Signed)
Thank you for coming in today. Proceed with COVID test. Make sure to tell me the results if not done at [facility].   Take short course of prednisone and antibiotic.  Keep track of blood oxygen saturation.   Practice self isolation.

## 2019-01-04 NOTE — Progress Notes (Signed)
Virtual Visit  via Video Note  I connected with      Morgan Garza by a video enabled telemedicine application and verified that I am speaking with the correct person using two identifiers.   I discussed the limitations of evaluation and management by telemedicine and the availability of in person appointments. The patient expressed understanding and agreed to proceed.  History of Present Illness: Morgan Garza is a 58 y.o. female who would like to discuss fever and headache.  Patient traveled to [place] on September 24 to visit her friend.  She became ill on Sunday, October 4 with fever fatigue and fever of 101 degrees.  She has the fever has improved as has nasal discharge and sneezing.  She notes a mild dry cough and some shortness of breath as well as a headache.  She has no known COVID-19 contacts.  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  She has not been to work since became ill.  She has not had a cover test yet.  She denies any history of asthma.    Observations/Objective: Pulse 77   Temp 99.7 F (37.6 C)   Wt 261 lb (118.4 kg)   LMP 11/09/2010   SpO2 98%   BMI 38.54 kg/m  Wt Readings from Last 5 Encounters:  01/04/19 261 lb (118.4 kg)  08/30/18 267 lb (121.1 kg)  08/25/18 269 lb (122 kg)  06/09/18 263 lb (119.3 kg)  04/15/18 267 lb (121.1 kg)   Exam: Appearance nontoxic Normal Speech.  No significant shortness of breath tachypnea or wheezing.  Able to complete sentences.  Lab and Radiology Results No results found for this or any previous visit (from the past 72 hour(s)). No results found.   Assessment and Plan: 58 y.o. female with URI symptoms obviously concerning for COVID-19.  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  Plan to proceed with outpatient COVID-19 testing.  Empiric treatment with prednisone and azithromycin now.  Patient will keep  me updated.  Recheck as needed.  PDMP not reviewed this encounter. No orders of the defined types were placed in this encounter.  Meds ordered this encounter  Medications  . predniSONE (DELTASONE) 10 MG tablet    Sig: Take 3 tablets (30 mg total) by mouth daily with breakfast.    Dispense:  15 tablet    Refill:  0  . azithromycin (ZITHROMAX) 250 MG tablet    Sig: Take 1 tablet (250 mg total) by mouth daily. Take first 2 tablets together, then 1 every day until finished.    Dispense:  6 tablet    Refill:  0    Follow Up Instructions:    I discussed the assessment and treatment plan with the patient. The patient was provided an opportunity to ask questions and all were answered. The patient agreed with the plan and demonstrated an understanding of the instructions.   The patient was advised to call back or seek an in-person evaluation if the symptoms worsen or if the condition fails to improve as anticipated.  Time: 15 minutes of intraservice time, with >22 minutes of total time during today's visit.      Historical information moved to improve visibility of documentation.  Past Medical History:  Diagnosis Date  . Diabetes mellitus without complication ([place])   . History of colon polyps   . Hyperlipidemia   . Hypertension  Past Surgical History:  Procedure Laterality Date  . NO PAST SURGERIES     Social History   Tobacco Use  . Smoking status: Current Every Day Smoker    Packs/day: 0.50    Years: 1.00    Pack years: 0.50    Types: Cigarettes    Last attempt to quit: 03/30/2018    Years since quitting: 0.7  . Smokeless tobacco: Never Used  Substance Use Topics  . Alcohol use: Not Currently    Frequency: Never   family history includes COPD in her mother; Cancer in her father; Diabetes in her daughter and son; Gastric cancer in her father; Hypertension in her mother; Testicular cancer in her son.  Medications: Current Outpatient Medications  Medication Sig  Dispense Refill  . amLODipine (NORVASC) 10 MG tablet Take 1 tablet (10 mg total) by mouth every morning. 90 tablet 1  . atorvastatin (LIPITOR) 20 MG tablet Take 1 tablet (20 mg total) by mouth at bedtime. 90 tablet 1  . azithromycin (ZITHROMAX) 250 MG tablet Take 1 tablet (250 mg total) by mouth daily. Take first 2 tablets together, then 1 every day until finished. 6 tablet 0  . Cholecalciferol (VITAMIN D3) 5000 units TABS Take by mouth.    . gabapentin (NEURONTIN) 300 MG capsule One tab PO qHS for a week, then BID for a week, then TID. May double weekly to a max of 3,600mg /day 180 capsule 3  . glipiZIDE (GLUCOTROL XL) 10 MG 24 hr tablet Take 1 tablet (10 mg total) by mouth daily before breakfast. 90 tablet 1  . glucose blood (PRECISION QID TEST) test strip Use one strip to test blood sugar.    . lisinopril-hydrochlorothiazide (ZESTORETIC) 20-25 MG tablet Take 1 tablet by mouth every morning. 90 tablet 1  . metFORMIN (GLUCOPHAGE-XR) 500 MG 24 hr tablet Take 2 tablets (1,000 mg total) by mouth daily with supper. 180 tablet 1  . nicotine (NICODERM CQ) 14 mg/24hr patch Place 1 patch (14 mg total) onto the skin daily. 28 patch 5  . predniSONE (DELTASONE) 10 MG tablet Take 3 tablets (30 mg total) by mouth daily with breakfast. 15 tablet 0   No current facility-administered medications for this visit.    Allergies  Allergen Reactions  . Bupropion Hives       . Varenicline Other (See Comments)    Vision changes, "everything was enhanced"

## 2019-01-05 ENCOUNTER — Other Ambulatory Visit: Payer: Self-pay

## 2019-01-05 ENCOUNTER — Encounter (INDEPENDENT_AMBULATORY_CARE_PROVIDER_SITE_OTHER): Payer: Self-pay

## 2019-01-05 ENCOUNTER — Telehealth: Payer: Self-pay

## 2019-01-05 ENCOUNTER — Encounter: Payer: Self-pay | Admitting: Family Medicine

## 2019-01-05 DIAGNOSIS — Z20822 Contact with and (suspected) exposure to covid-19: Secondary | ICD-10-CM

## 2019-01-05 NOTE — Telephone Encounter (Signed)
PEC Courtesy call -   Patient reports cough - dry - has increased since yesterday; SOB - becoming more labored. Patient denies:fever, diarrhea, vomiting. Patient reports appetite, weakness -same.   Reviewed MyChart Companion for Home Monitoring protocol for SOB and cough with patient - advised to contact PCP if needed.    Patient stated she understood - no further questions.

## 2019-01-06 ENCOUNTER — Encounter (INDEPENDENT_AMBULATORY_CARE_PROVIDER_SITE_OTHER): Payer: Self-pay

## 2019-01-06 LAB — NOVEL CORONAVIRUS, NAA: SARS-CoV-2, NAA: NOT DETECTED

## 2019-01-07 ENCOUNTER — Encounter (INDEPENDENT_AMBULATORY_CARE_PROVIDER_SITE_OTHER): Payer: Self-pay

## 2019-01-08 ENCOUNTER — Encounter (INDEPENDENT_AMBULATORY_CARE_PROVIDER_SITE_OTHER): Payer: Self-pay

## 2019-01-09 ENCOUNTER — Encounter (INDEPENDENT_AMBULATORY_CARE_PROVIDER_SITE_OTHER): Payer: Self-pay

## 2019-01-10 ENCOUNTER — Encounter (INDEPENDENT_AMBULATORY_CARE_PROVIDER_SITE_OTHER): Payer: Self-pay

## 2019-01-11 ENCOUNTER — Encounter (INDEPENDENT_AMBULATORY_CARE_PROVIDER_SITE_OTHER): Payer: Self-pay

## 2019-01-11 ENCOUNTER — Other Ambulatory Visit: Payer: Self-pay

## 2019-01-11 DIAGNOSIS — Z20822 Contact with and (suspected) exposure to covid-19: Secondary | ICD-10-CM

## 2019-01-12 ENCOUNTER — Encounter (INDEPENDENT_AMBULATORY_CARE_PROVIDER_SITE_OTHER): Payer: Self-pay

## 2019-01-12 LAB — NOVEL CORONAVIRUS, NAA: SARS-CoV-2, NAA: NOT DETECTED

## 2019-01-17 ENCOUNTER — Encounter (INDEPENDENT_AMBULATORY_CARE_PROVIDER_SITE_OTHER): Payer: Self-pay

## 2019-05-12 ENCOUNTER — Telehealth: Payer: Self-pay | Admitting: Nurse Practitioner

## 2019-05-12 NOTE — Telephone Encounter (Signed)
Patient is overdue for diabetes management. She was a patient of [facility]. Please call and schedule a visit with me at her earliest convenience. Thank you so much!

## 2019-05-12 NOTE — Telephone Encounter (Signed)
Appointment for follow up on diabetes has been made for 05/22/2019. AM

## 2019-05-22 ENCOUNTER — Ambulatory Visit: Payer: Self-pay | Admitting: Nurse Practitioner

## 2019-06-12 ENCOUNTER — Ambulatory Visit (INDEPENDENT_AMBULATORY_CARE_PROVIDER_SITE_OTHER): Payer: Self-pay | Admitting: Osteopathic Medicine

## 2019-06-12 ENCOUNTER — Other Ambulatory Visit: Payer: Self-pay

## 2019-06-12 ENCOUNTER — Encounter: Payer: Self-pay | Admitting: Osteopathic Medicine

## 2019-06-12 VITALS — BP 145/81 | HR 90 | Temp 98.2°F | Ht 67.91 in | Wt 244.0 lb

## 2019-06-12 DIAGNOSIS — Z1231 Encounter for screening mammogram for malignant neoplasm of breast: Secondary | ICD-10-CM

## 2019-06-12 DIAGNOSIS — Z0000 Encounter for general adult medical examination without abnormal findings: Secondary | ICD-10-CM

## 2019-06-12 DIAGNOSIS — Z86010 Personal history of colonic polyps: Secondary | ICD-10-CM

## 2019-06-12 DIAGNOSIS — I10 Essential (primary) hypertension: Secondary | ICD-10-CM

## 2019-06-12 DIAGNOSIS — E1159 Type 2 diabetes mellitus with other circulatory complications: Secondary | ICD-10-CM

## 2019-06-12 DIAGNOSIS — Z1211 Encounter for screening for malignant neoplasm of colon: Secondary | ICD-10-CM

## 2019-06-12 DIAGNOSIS — Z9889 Other specified postprocedural states: Secondary | ICD-10-CM

## 2019-06-12 DIAGNOSIS — I152 Hypertension secondary to endocrine disorders: Secondary | ICD-10-CM

## 2019-06-12 DIAGNOSIS — E119 Type 2 diabetes mellitus without complications: Secondary | ICD-10-CM

## 2019-06-12 LAB — POCT GLYCOSYLATED HEMOGLOBIN (HGB A1C): Hemoglobin A1C: 6.7 % — AB (ref 4.0–5.6)

## 2019-06-12 MED ORDER — ATORVASTATIN CALCIUM 20 MG PO TABS: 20.0000 mg | ORAL_TABLET | Freq: Every day | ORAL | 3 refills | Status: DC

## 2019-06-12 MED ORDER — METFORMIN HCL ER 500 MG PO TB24: 1000.0000 mg | ORAL_TABLET | Freq: Every day | ORAL | 3 refills | Status: DC

## 2019-06-12 MED ORDER — AMLODIPINE BESYLATE 10 MG PO TABS: 10.0000 mg | ORAL_TABLET | Freq: Every day | ORAL | 3 refills | Status: DC

## 2019-06-12 MED ORDER — GLIPIZIDE ER 10 MG PO TB24: 10.0000 mg | ORAL_TABLET | Freq: Every day | ORAL | 3 refills | Status: DC

## 2019-06-12 MED ORDER — LISINOPRIL-HYDROCHLOROTHIAZIDE 20-25 MG PO TABS: 1.0000 | ORAL_TABLET | Freq: Every day | ORAL | 3 refills | Status: DC

## 2019-06-12 NOTE — Patient Instructions (Addendum)
General Preventive Care  Most recent routine screening lipids/other labs: ordered  Everyone should have blood pressure checked once per year.   Tobacco: don't!   Alcohol: responsible moderation is ok for most adults - if you have concerns about your alcohol intake, please talk to me!   Exercise: as tolerated to reduce risk of cardiovascular disease and diabetes. Strength training will also prevent osteoporosis.   Mental health: if need for mental health care (medicines, counseling, other), or concerns about moods, please let me know!   Sexual health: if need for STD testing, or if concerns with libido/pain problems, please let me know!  Advanced Directive: Living Will and/or Healthcare Power of Attorney recommended for all adults, regardless of age or health.  Vaccines  Flu vaccine: recommended for almost everyone, every fall.   Shingles vaccine: Shingrix recommended after age 52. Can schedule nurse visit once you have insurance.    Pneumonia vaccines: Prevnar and Pneumovax recommended after age 27, or sooner if certain medical conditions.  Tetanus booster: Tdap recommended every 10 years.   COVID: give [place] dates!  Cancer screenings   Colon cancer screening: recommended for everyone at age 63, but some folks need a colonoscopy sooner if risk factors   Breast cancer screening: mammogram recommended annually after age 64.   Cervical cancer screening: Pap every year for smokers!   Lung cancer screening: not needed  Infection screenings . HIV: recommended screening at least once age 43-65, more often as needed. . Gonorrhea/Chlamydia: screening as needed . Hepatitis C: recommended for anyone born 74-1966 . TB: certain at-risk populations, or depending on work requirements and/or travel history Other . Bone Density Test: recommended for women at age 19

## 2019-06-12 NOTE — Progress Notes (Signed)
HPI: Morgan Garza is a 59 y.o. female who  has a past medical history of Diabetes mellitus without complication ([place]), History of colon polyps, Hyperlipidemia, and Hypertension.  she presents to [facility] today, 06/12/19,  for chief complaint of: Annual Check-up     Patient here for annual physical / wellness exam.  See preventive care reviewed as below.  Recent labs reviewed in detail with the patient.   Additional concerns today include:  Needs screening but not insured right now    Past medical, surgical, social and family history reviewed:  Patient Active Problem List   Diagnosis Date Noted  . Cervicalgia 04/06/2018  . Closed head injury with concussion 04/06/2018  . Fall (on) (from) other stairs and steps, initial encounter 04/06/2018  . Cigarette nicotine dependence without complication [id number]  . Sebaceous cyst of labia 02/06/2018  . Mass of axilla, left 02/04/2018  . Postmenopausal 02/02/2018  . History of colon polyps   . Allergic rhinitis 09/04/2015  . BMI 39.0-39.9,adult 09/04/2015  . Hypertension associated with diabetes ([place]) 09/04/2015  . Hyperlipidemia 09/04/2015  . Type 2 diabetes mellitus without complication, without long-term current use of insulin ([place]) 04/26/2012  . Sciatica 04/22/2012    Past Surgical History:  Procedure Laterality Date  . NO PAST SURGERIES      Social History   Tobacco Use  . Smoking status: Current Every Day Smoker    Packs/day: 0.50    Years: 1.00    Pack years: 0.50    Types: Cigarettes    Last attempt to quit: 03/30/2018    Years since quitting: 1.2  . Smokeless tobacco: Never Used  Substance Use Topics  . Alcohol use: Not Currently    Family History  Problem Relation Age of Onset  . Hypertension Mother   . COPD Mother   . Cancer Father   . Gastric cancer Father   . Testicular cancer Son   . Diabetes Son   . Diabetes Daughter      Current medication list and  allergy/intolerance information reviewed:    Current Outpatient Medications  Medication Sig Dispense Refill  . amLODipine (NORVASC) 10 MG tablet Take 1 tablet (10 mg total) by mouth daily. 90 tablet 3  . atorvastatin (LIPITOR) 20 MG tablet Take 1 tablet (20 mg total) by mouth daily. 90 tablet 3  . Cholecalciferol (VITAMIN D3) 5000 units TABS Take by mouth.    . gabapentin (NEURONTIN) 300 MG capsule One tab PO qHS for a week, then BID for a week, then TID. May double weekly to a max of 3,600mg /day 180 capsule 3  . glipiZIDE (GLUCOTROL XL) 10 MG 24 hr tablet Take 1 tablet (10 mg total) by mouth daily with breakfast. 90 tablet 3  . glucose blood (PRECISION QID TEST) test strip Use one strip to test blood sugar.    . lisinopril-hydrochlorothiazide (ZESTORETIC) 20-25 MG tablet Take 1 tablet by mouth daily. 90 tablet 3  . metFORMIN (GLUCOPHAGE-XR) 500 MG 24 hr tablet Take 2 tablets (1,000 mg total) by mouth daily with breakfast. 180 tablet 3  . nicotine (NICODERM CQ) 14 mg/24hr patch Place 1 patch (14 mg total) onto the skin daily. 28 patch 5   No current facility-administered medications for this visit.    Allergies  Allergen Reactions  . Bupropion Hives       . Varenicline Other (See Comments)    Vision changes, "everything was enhanced"      Review of Systems:  Constitutional:  No  fever, no chills, No recent illness, No unintentional weight changes. No significant fatigue.   HEENT: No  headache,  Cardiac: No  chest pain, No  pressure, No palpitations, No  Orthopnea  Respiratory:  No  shortness of breath. No  Cough  Gastrointestinal: No  abdominal pain, No  nausea,  Musculoskeletal: No new myalgia/arthralgia  Skin: No  Rash, No other wounds/concerning lesions  Hem/Onc: No  easy bruising/bleeding, No  abnormal lymph node  Neurologic: No  weakness, No  dizziness  Psychiatric: No  concerns with depression, No  concerns with anxiety, No sleep problems, No mood  problems  Exam:  BP (!) 145/81 (BP Location: Right Arm, Patient Position: Sitting, Cuff Size: Large)   Pulse 90   Temp 98.2 F (36.8 C) (Oral)   Ht 5' 7.91" (1.725 m)   Wt 244 lb (110.7 kg)   LMP 11/09/2010   BMI 37.20 kg/m   Constitutional: VS see above. General Appearance: alert, well-developed, well-nourished, NAD  Eyes: Normal lids and conjunctive, non-icteric sclera  Neck: No masses, trachea midline. No thyroid enlargement. No tenderness/mass appreciated. No lymphadenopathy  Respiratory: Normal respiratory effort. no wheeze, no rhonchi, no rales  Cardiovascular: S1/S2 normal, no murmur, no rub/gallop auscultated. RRR. No lower extremity edema.   Gastrointestinal: Nontender, no masses. No hepatomegaly, no splenomegaly. No hernia appreciated. Bowel sounds normal. Rectal exam deferred.   Musculoskeletal: Gait normal. No clubbing/cyanosis of digits.   Neurological: Normal balance/coordination. No tremor. No cranial nerve deficit on limited exam. Motor and sensation intact and symmetric. Cerebellar reflexes intact.   Skin: warm, dry, intact. No rash/ulcer. No concerning nevi or subq nodules on limited exam.    Psychiatric: Normal judgment/insight. Normal mood and affect. Oriented x3.    Results for orders placed or performed in visit on 06/12/19 (from the past 72 hour(s))  POCT HgB A1C     Status: Abnormal   Collection Time: 06/12/19 11:11 AM  Result Value Ref Range   Hemoglobin A1C 6.7 (A) 4.0 - 5.6 %   HbA1c POC (<> result, manual entry)     HbA1c, POC (prediabetic range)     HbA1c, POC (controlled diabetic range)        ASSESSMENT/PLAN: The primary encounter diagnosis was Annual physical exam. Diagnoses of Type 2 diabetes mellitus without complication, without long-term current use of insulin ([place]), History of colonoscopy, Screening for malignant neoplasm of colon, Breast cancer screening by mammogram, History of colon polyps, and Hypertension associated with diabetes  ([place]) were also pertinent to this visit.  Advised schedule screening (mammo, colonoscopy, Pap, labs) once she has insurance / check w/ health dept   Orders Placed This Encounter  Procedures  . MM 3D SCREEN BREAST BILATERAL  . CBC  . COMPLETE METABOLIC PANEL WITH GFR  . Lipid panel  . HIV Antibody (routine testing w rflx)  . Hepatitis C antibody  . Ambulatory referral to Gastroenterology  . POCT HgB A1C    Meds ordered this encounter  Medications  . amLODipine (NORVASC) 10 MG tablet    Sig: Take 1 tablet (10 mg total) by mouth daily.    Dispense:  90 tablet    Refill:  3  . atorvastatin (LIPITOR) 20 MG tablet    Sig: Take 1 tablet (20 mg total) by mouth daily.    Dispense:  90 tablet    Refill:  3  . glipiZIDE (GLUCOTROL XL) 10 MG 24 hr tablet    Sig: Take 1 tablet (10 mg total) by  mouth daily with breakfast.    Dispense:  90 tablet    Refill:  3  . lisinopril-hydrochlorothiazide (ZESTORETIC) 20-25 MG tablet    Sig: Take 1 tablet by mouth daily.    Dispense:  90 tablet    Refill:  3  . metFORMIN (GLUCOPHAGE-XR) 500 MG 24 hr tablet    Sig: Take 2 tablets (1,000 mg total) by mouth daily with breakfast.    Dispense:  180 tablet    Refill:  3    Patient Instructions  General Preventive Care  Most recent routine screening lipids/other labs: ordered  Everyone should have blood pressure checked once per year.   Tobacco: don't!   Alcohol: responsible moderation is ok for most adults - if you have concerns about your alcohol intake, please talk to me!   Exercise: as tolerated to reduce risk of cardiovascular disease and diabetes. Strength training will also prevent osteoporosis.   Mental health: if need for mental health care (medicines, counseling, other), or concerns about moods, please let me know!   Sexual health: if need for STD testing, or if concerns with libido/pain problems, please let me know!  Advanced Directive: Living Will and/or Healthcare Power of  Attorney recommended for all adults, regardless of age or health.  Vaccines  Flu vaccine: recommended for almost everyone, every fall.   Shingles vaccine: Shingrix recommended after age 6. Can schedule nurse visit once you have insurance.    Pneumonia vaccines: Prevnar and Pneumovax recommended after age 75, or sooner if certain medical conditions.  Tetanus booster: Tdap recommended every 10 years.   COVID: give [place] dates!  Cancer screenings   Colon cancer screening: recommended for everyone at age 41, but some folks need a colonoscopy sooner if risk factors   Breast cancer screening: mammogram recommended annually after age 84.   Cervical cancer screening: Pap every year for smokers!   Lung cancer screening: not needed  Infection screenings . HIV: recommended screening at least once age 101-65, more often as needed. . Gonorrhea/Chlamydia: screening as needed . Hepatitis C: recommended for anyone born 58-1966 . TB: certain at-risk populations, or depending on work requirements and/or travel history Other . Bone Density Test: recommended for women at age 53        Visit summary with medication list and pertinent instructions was printed for patient to review. All questions at time of visit were answered - patient instructed to contact office with any additional concerns or updates. ER/RTC precautions were reviewed with the patient.    Please note: voice recognition software was used to produce this document, and typos may escape review. Please contact Dr. Sheppard Coil for any needed clarifications.     Follow-up plan: Return in about 6 months (around 12/13/2019) for recheck A1C, monitor bloos pressure. See [place] sooner if needed! Marland Kitchen

## 2019-06-19 ENCOUNTER — Telehealth: Payer: Self-pay | Admitting: Gastroenterology

## 2019-06-20 NOTE — Telephone Encounter (Signed)
Colonoscopy report received from [facility].  Colonoscopy completed 07/07/2012 by Dr. Eusebio Friendly, MD and notable for the following:  -2 subcentimeter tubular adenomas resected from the sigmoid colon, 2 subcentimeter hyperplastic polyps, small internal hemorrhoids.  Recommended repeat in 5 years  Given the finding of 2 small subcentimeter tubular adenomas, along with guideline recommendations at that time, agree with plan for repeat colonoscopy at this time for ongoing polyp surveillance.  Okay to schedule direct with me for colonoscopy.  Thank you.

## 2019-07-04 NOTE — Telephone Encounter (Signed)
Dr. Bryan Lemma reviewed previous colon report and okay to schedule direct colon.  LM on vmail to call back to schedule. Records at [facility] office

## 2019-07-05 ENCOUNTER — Ambulatory Visit (INDEPENDENT_AMBULATORY_CARE_PROVIDER_SITE_OTHER): Payer: 59

## 2019-07-05 ENCOUNTER — Encounter: Payer: Self-pay | Admitting: Gastroenterology

## 2019-07-05 ENCOUNTER — Other Ambulatory Visit: Payer: Self-pay

## 2019-07-05 DIAGNOSIS — Z0000 Encounter for general adult medical examination without abnormal findings: Secondary | ICD-10-CM

## 2019-07-05 DIAGNOSIS — Z1231 Encounter for screening mammogram for malignant neoplasm of breast: Secondary | ICD-10-CM

## 2019-07-05 LAB — CBC
HCT: 41.4 % (ref 35.0–45.0)
Hemoglobin: 13.1 g/dL (ref 11.7–15.5)
MCH: 26.3 pg — ABNORMAL LOW (ref 27.0–33.0)
MCHC: 31.6 g/dL — ABNORMAL LOW (ref 32.0–36.0)
MCV: 83.1 fL (ref 80.0–100.0)
MPV: 11.5 fL (ref 7.5–12.5)
Platelets: 222 10*3/uL (ref 140–400)
RBC: 4.98 10*6/uL (ref 3.80–5.10)
RDW: 14.8 % (ref 11.0–15.0)
WBC: 4.4 10*3/uL (ref 3.8–10.8)

## 2019-07-05 LAB — COMPLETE METABOLIC PANEL WITH GFR
AG Ratio: 1.6 (calc) (ref 1.0–2.5)
ALT: 12 U/L (ref 6–29)
AST: 13 U/L (ref 10–35)
Albumin: 4.2 g/dL (ref 3.6–5.1)
Alkaline phosphatase (APISO): 54 U/L (ref 37–153)
BUN/Creatinine Ratio: 19 (calc) (ref 6–22)
BUN: 20 mg/dL (ref 7–25)
CO2: 26 mmol/L (ref 20–32)
Calcium: 9.5 mg/dL (ref 8.6–10.4)
Chloride: 103 mmol/L (ref 98–110)
Creat: 1.06 mg/dL — ABNORMAL HIGH (ref 0.50–1.05)
GFR, Est African American: 67 mL/min/{1.73_m2} (ref >=60)
GFR, Est Non African American: 57 mL/min/{1.73_m2} — ABNORMAL LOW (ref >=60)
Globulin: 2.7 g/dL (calc) (ref 1.9–3.7)
Glucose, Bld: 239 mg/dL — ABNORMAL HIGH (ref 65–99)
Potassium: 4.8 mmol/L (ref 3.5–5.3)
Sodium: 137 mmol/L (ref 135–146)
Total Bilirubin: 0.4 mg/dL (ref 0.2–1.2)
Total Protein: 6.9 g/dL (ref 6.1–8.1)

## 2019-07-05 LAB — HEPATITIS C ANTIBODY
Hepatitis C Ab: NONREACTIVE
SIGNAL TO CUT-OFF: 0.01 (ref <1.00)

## 2019-07-05 LAB — LIPID PANEL
Cholesterol: 166 mg/dL (ref <200)
HDL: 59 mg/dL (ref >=50)
LDL Cholesterol (Calc): 89 mg/dL (calc)
Non-HDL Cholesterol (Calc): 107 mg/dL (calc) (ref <130)
Total CHOL/HDL Ratio: 2.8 (calc) (ref <5.0)
Triglycerides: 87 mg/dL (ref <150)

## 2019-07-05 LAB — HIV ANTIBODY (ROUTINE TESTING W REFLEX): HIV 1&2 Ab, 4th Generation: NONREACTIVE

## 2019-08-02 ENCOUNTER — Encounter: Payer: Self-pay | Admitting: Gastroenterology

## 2019-08-02 ENCOUNTER — Other Ambulatory Visit: Payer: Self-pay

## 2019-08-02 ENCOUNTER — Ambulatory Visit: Payer: 59 | Admitting: Gastroenterology

## 2019-08-02 VITALS — BP 130/78 | HR 74 | Temp 97.8°F | Ht 68.0 in | Wt 241.1 lb

## 2019-08-02 DIAGNOSIS — K648 Other hemorrhoids: Secondary | ICD-10-CM | POA: Diagnosis not present

## 2019-08-02 DIAGNOSIS — Z86010 Personal history of colonic polyps: Secondary | ICD-10-CM | POA: Diagnosis not present

## 2019-08-02 MED ORDER — CLENPIQ 10-3.5-12 MG-GM -GM/160ML PO SOLN: 1.0000 | Freq: Once | ORAL | 0 refills | Status: AC

## 2019-08-02 NOTE — Patient Instructions (Signed)
You have been scheduled for a colonoscopy. Please follow written instructions given to you at your visit today.  Please pick up your prep supplies at the pharmacy within the next 1-3 days. If you use inhalers (even only as needed), please bring them with you on the day of your procedure. Your physician has requested that you go to www.startemmi.com and enter the access code given to you at your visit today. This web site gives a general overview about your procedure. However, you should still follow specific instructions given to you by our office regarding your preparation for the procedure.  It was a pleasure to see you today!  Vito Cirigliano, D.O.  

## 2019-08-02 NOTE — Progress Notes (Signed)
Chief Complaint: Colon cancer screening/polyp surveillance  Referring Provider:     Emeterio Reeve, DO   HPI:    Leialoha Cease is a 59 y.o. female with a history of diabetes (A1c 6.7%), hyperlipidemia, hypertension, referred to the [facility] for evaluation of ongoing colon cancer screening/polyp surveillance.  History of colon polyps on colonoscopy 2014 as outlined above.  Today, she states she is otherwise without GI symptoms.  Reports hx of hemorrhoids, with only 2 episodes of rectal itching over the last 7 years or so. No hematochezia. She wants to discuss repeat colonoscopy for ongoing polyp surveillance given prior history of colon polyps.  Otherwise, feels in her usual state of health.  No complaints or acute issues today.  Interestingly, she reports waking up during her last colonoscopy (record reviewed, and was given propofol 400 mg).  Endoscopic history: -Colonoscopy (07/07/2012, Dr. Eusebio Friendly, MD at [facility]): 2 subcentimeter tubular adenomas resected from the sigmoid colon, 2 subcentimeter hyperplastic polyps, small internal hemorrhoids.  Recommended repeat in 5 years  -07/04/2019: Creatinine 1.06, BG 239, otherwise normal CMP, normal CBC, negative HCV   Past Medical History:  Diagnosis Date  . Anemia   . Arthritis   . Diabetes mellitus without complication ([place])   . History of colon polyps   . Hyperlipidemia   . Hypertension   . Obesity      Past Surgical History:  Procedure Laterality Date  . COLONOSCOPY  07/07/2012   [facility]  . NO PAST SURGERIES     Family History  Problem Relation Age of Onset  . Hypertension Mother   . COPD Mother   . Cancer Father   . Gastric cancer Father   . Testicular cancer Son   . Diabetes Son   . Diabetes Daughter   . Esophageal cancer Neg Hx   . Colon cancer Neg Hx    Social History   Tobacco Use  . Smoking status: Current Every Day Smoker     Packs/day: 0.50    Years: 1.00    Pack years: 0.50    Types: Cigarettes    Last attempt to quit: 03/30/2018    Years since quitting: 1.3  . Smokeless tobacco: Never Used  Substance Use Topics  . Alcohol use: Yes  . Drug use: Never   Current Outpatient Medications  Medication Sig Dispense Refill  . amLODipine (NORVASC) 10 MG tablet Take 1 tablet (10 mg total) by mouth daily. 90 tablet 3  . atorvastatin (LIPITOR) 20 MG tablet Take 1 tablet (20 mg total) by mouth daily. 90 tablet 3  . Cholecalciferol (VITAMIN D3) 5000 units TABS Take by mouth.    . gabapentin (NEURONTIN) 300 MG capsule One tab PO qHS for a week, then BID for a week, then TID. May double weekly to a max of 3,600mg /day 180 capsule 3  . glipiZIDE (GLUCOTROL XL) 10 MG 24 hr tablet Take 1 tablet (10 mg total) by mouth daily with breakfast. 90 tablet 3  . glucose blood (PRECISION QID TEST) test strip Use one strip to test blood sugar.    . lisinopril-hydrochlorothiazide (ZESTORETIC) 20-25 MG tablet Take 1 tablet by mouth daily. 90 tablet 3  . metFORMIN (GLUCOPHAGE-XR) 500 MG 24 hr tablet Take 2 tablets (1,000 mg total) by mouth daily with breakfast. 180 tablet 3  . nicotine (NICODERM CQ) 14 mg/24hr patch Place 1 patch (14 mg total) onto the skin daily.  28 patch 5   No current facility-administered medications for this visit.   Allergies  Allergen Reactions  . Bupropion Hives       . Varenicline Other (See Comments)    Vision changes, "everything was enhanced"     Review of Systems: All systems reviewed and negative except where noted in HPI.     Physical Exam:    Wt Readings from Last 3 Encounters:  08/02/19 241 lb 2 oz (109.4 kg)  06/12/19 244 lb (110.7 kg)  01/04/19 261 lb (118.4 kg)    BP 130/78   Pulse 74   Temp 97.8 F (36.6 C)   Ht 5\' 8"  (1.727 m)   Wt 241 lb 2 oz (109.4 kg)   LMP 11/09/2010   BMI 36.66 kg/m  Constitutional:  Pleasant, in no acute distress. Psychiatric: Normal mood and affect.  Behavior is normal. EENT: Pupils normal.  Conjunctivae are normal. No scleral icterus. Neck supple. No cervical LAD. Cardiovascular: Normal rate, regular rhythm. No edema Pulmonary/chest: Effort normal and breath sounds normal. No wheezing, rales or rhonchi. Abdominal: Soft, nondistended, nontender. Bowel sounds active throughout. There are no masses palpable. No hepatomegaly. Neurological: Alert and oriented to person place and time. Skin: Skin is warm and dry. No rashes noted.   ASSESSMENT AND PLAN;   1) History of colon polyps (adenomas): Colonoscopy in 2014 with 2 subcentimeter tubular adenomas, with recommendation repeat in 5 years.  Per established guidelines at that time and previous Endoscopist's recommendations, along with current societal guidelines, I agree with repeat colonoscopy now for ongoing polyp surveillance. -Schedule colonoscopy -Of note, patient reports waking up during her last colonoscopy.  Record reviewed and was given propofol 40 mg IV  2) Internal hemorrhoids: -Hemorrhoids noted times previous colonoscopy.  These are rarely symptomatic -Conservative management with plenty of fluids -Evaluate grade/severity at time of colonoscopy, but do not suspect hemorrhoid band ligation  The indications, risks, and benefits of colonoscopy were explained to the patient in detail. Risks include but are not limited to bleeding, perforation, adverse reaction to medications, and cardiopulmonary compromise. Sequelae include but are not limited to the possibility of surgery, hospitalization, and mortality. The patient verbalized understanding and wished to proceed. All questions answered, referred to the scheduler and bowel prep ordered. Further recommendations pending results of the exam.   Lavena Bullion, DO, FACG  08/02/2019, 9:18 AM   Emeterio Reeve, DO

## 2019-08-07 ENCOUNTER — Encounter: Payer: 59 | Admitting: Gastroenterology

## 2019-08-15 ENCOUNTER — Other Ambulatory Visit: Payer: Self-pay

## 2019-08-15 ENCOUNTER — Encounter: Payer: Self-pay | Admitting: Gastroenterology

## 2019-08-15 ENCOUNTER — Ambulatory Visit (AMBULATORY_SURGERY_CENTER): Payer: 59 | Admitting: Gastroenterology

## 2019-08-15 VITALS — BP 118/72 | HR 54 | Temp 96.8°F | Resp 10 | Ht 68.0 in | Wt 241.0 lb

## 2019-08-15 DIAGNOSIS — D128 Benign neoplasm of rectum: Secondary | ICD-10-CM

## 2019-08-15 DIAGNOSIS — D125 Benign neoplasm of sigmoid colon: Secondary | ICD-10-CM | POA: Diagnosis not present

## 2019-08-15 DIAGNOSIS — Z86010 Personal history of colonic polyps: Secondary | ICD-10-CM

## 2019-08-15 DIAGNOSIS — K640 First degree hemorrhoids: Secondary | ICD-10-CM

## 2019-08-15 MED ORDER — SODIUM CHLORIDE 0.9 % IV SOLN: 500.0000 mL | Freq: Once | INTRAVENOUS | Status: DC

## 2019-08-15 NOTE — Progress Notes (Signed)
Called to room to assist during endoscopic procedure.  Patient ID and intended procedure confirmed with present staff. Received instructions for my participation in the procedure from the performing physician.  

## 2019-08-15 NOTE — Progress Notes (Signed)
To PACU, VSS. Report to [email or web address] 

## 2019-08-15 NOTE — Patient Instructions (Signed)
Handouts on polyps & hemorrhoids given to you today  Await pathology on polyps removed today   Use fiber such as Citrucel,Fibercon,Konsyl,or Metamucil     YOU HAD AN ENDOSCOPIC PROCEDURE TODAY AT [facility]:   Refer to the procedure report that was given to you for any specific questions about what was found during the examination.  If the procedure report does not answer your questions, please call your gastroenterologist to clarify.  If you requested that your care partner not be given the details of your procedure findings, then the procedure report has been included in a sealed envelope for you to review at your convenience later.  YOU SHOULD EXPECT: Some feelings of bloating in the abdomen. Passage of more gas than usual.  Walking can help get rid of the air that was put into your GI tract during the procedure and reduce the bloating. If you had a lower endoscopy (such as a colonoscopy or flexible sigmoidoscopy) you may notice spotting of blood in your stool or on the toilet paper. If you underwent a bowel prep for your procedure, you may not have a normal bowel movement for a few days.  Please Note:  You might notice some irritation and congestion in your nose or some drainage.  This is from the oxygen used during your procedure.  There is no need for concern and it should clear up in a day or so.  SYMPTOMS TO REPORT IMMEDIATELY:   Following lower endoscopy (colonoscopy or flexible sigmoidoscopy):  Excessive amounts of blood in the stool  Significant tenderness or worsening of abdominal pains  Swelling of the abdomen that is new, acute  Fever of 100F or higher    For urgent or emergent issues, a gastroenterologist can be reached at any hour by calling [phone]. Do not use MyChart messaging for urgent concerns.    DIET:  We do recommend a small meal at first, but then you may proceed to your regular diet.  Drink plenty of fluids but you should avoid  alcoholic beverages for 24 hours.  ACTIVITY:  You should plan to take it easy for the rest of today and you should NOT DRIVE or use heavy machinery until tomorrow (because of the sedation medicines used during the test).    FOLLOW UP: Our staff will call the number listed on your records 48-72 hours following your procedure to check on you and address any questions or concerns that you may have regarding the information given to you following your procedure. If we do not reach you, we will leave a message.  We will attempt to reach you two times.  During this call, we will ask if you have developed any symptoms of COVID 19. If you develop any symptoms (ie: fever, flu-like symptoms, shortness of breath, cough etc.) before then, please call [phone].  If you test positive for Covid 19 in the 2 weeks post procedure, please call and report this information to [place].    If any biopsies were taken you will be contacted by phone or by letter within the next 1-3 weeks.  Please call us at [phone] if you have not heard about the biopsies in 3 weeks.    SIGNATURES/CONFIDENTIALITY: You and/or your care partner have signed paperwork which will be entered into your electronic medical record.  These signatures attest to the fact that that the information above on your After Visit Summary has been reviewed and is understood.  Full responsibility of  the confidentiality of this discharge information lies with you and/or your care-partner. 

## 2019-08-15 NOTE — Progress Notes (Signed)
Pt's states no medical or surgical changes since previsit or office visit. 

## 2019-08-15 NOTE — Op Note (Signed)
[facility] Patient Name: Morgan Garza Procedure Date: 08/15/2019 11:55 AM MRN: [id number] Endoscopist: Gerrit Heck , MD Age: 59 Referring MD:  Date of Birth: 19-Mar-1961 Gender: Female Account #: [id number] Procedure:                Colonoscopy Indications:              Surveillance: Personal history of adenomatous                            polyps on last colonoscopy > 5 years ago                           Colonoscopy in 06/2012 with 2 subcentimeter tubular                            adenomas and 2 small hyperplastic polyps, with                            recommendation to repeat in 5 years. Otherwise, no                            active GI symptoms. Medicines:                Monitored Anesthesia Care Procedure:                Pre-Anesthesia Assessment:                           - Prior to the procedure, a History and Physical                            was performed, and patient medications and                            allergies were reviewed. The patient's tolerance of                            previous anesthesia was also reviewed. The risks                            and benefits of the procedure and the sedation                            options and risks were discussed with the patient.                            All questions were answered, and informed consent                            was obtained. Prior Anticoagulants: The patient has                            taken no previous anticoagulant or antiplatelet  agents. ASA Grade Assessment: II - A patient with                            mild systemic disease. After reviewing the risks                            and benefits, the patient was deemed in                            satisfactory condition to undergo the procedure.                           After obtaining informed consent, the colonoscope                            was passed under direct vision. Throughout the                          procedure, the patient's blood pressure, pulse, and                            oxygen saturations were monitored continuously. The                            Colonoscope was introduced through the anus and                            advanced to the the terminal ileum. The colonoscopy                            was performed without difficulty. The patient                            tolerated the procedure well. The quality of the                            bowel preparation was adequate. The terminal ileum,                            ileocecal valve, appendiceal orifice, and rectum                            were photographed. Scope In: 12:04:03 PM Scope Out: 12:28:27 PM Scope Withdrawal Time: 0 hours 17 minutes 59 seconds  Total Procedure Duration: 0 hours 24 minutes 24 seconds  Findings:                 The perianal and digital rectal examinations were                            normal.                           Two pedunculated and semi-pedunculated polyps were  found in the distal sigmoid colon. The polyps were                            5 to 8 mm in size. These polyps were removed with a                            hot snare. Resection and retrieval were complete.                            Estimated blood loss: none.                           Four sessile polyps were found in the sigmoid                            colon. The polyps were 3 to 6 mm in size. These                            polyps were removed with a cold snare. Resection                            and retrieval were complete. Estimated blood loss                            was minimal.                           Multiple sessile polyps were found in the rectum.                            The polyps were 1 to 3 mm in size. Several of these                            polyps were removed with a cold snare for                            histologic representative evaluation.  Resection and                            retrieval were complete. Estimated blood loss was                            minimal.                           Non-bleeding internal hemorrhoids were found during                            retroflexion. The hemorrhoids were small.                           The terminal ileum appeared normal. Complications:            No immediate complications. Estimated Blood Loss:  Estimated blood loss was minimal. Impression:               - Two 5 to 8 mm polyps in the distal sigmoid colon,                            removed with a hot snare. Resected and retrieved.                           - Four 3 to 6 mm polyps in the sigmoid colon,                            removed with a cold snare. Resected and retrieved.                           - Multiple 1 to 3 mm polyps in the rectum, removed                            with a cold snare. Resected and retrieved.                           - Non-bleeding internal hemorrhoids.                           - The examined portion of the ileum was normal. Recommendation:           - Patient has a contact number available for                            emergencies. The signs and symptoms of potential                            delayed complications were discussed with the                            patient. Return to normal activities tomorrow.                            Written discharge instructions were provided to the                            patient.                           - Resume previous diet.                           - Continue present medications.                           - Await pathology results.                           - Repeat colonoscopy in 3 - 5 years for  surveillance based on pathology results.                           - Return to GI office PRN.                           - Use fiber, for example Citrucel, Fibercon, Konsyl                            or Metamucil. Gerrit Heck, MD 08/15/2019 12:35:41 PM

## 2019-08-17 ENCOUNTER — Telehealth: Payer: Self-pay | Admitting: *Deleted

## 2019-08-17 ENCOUNTER — Telehealth: Payer: Self-pay

## 2019-08-17 NOTE — Telephone Encounter (Signed)
NO ANSWER, MESSAGE LEFT FOR PATIENT. 

## 2019-08-17 NOTE — Telephone Encounter (Signed)
  Follow up Call-  Call back number 08/15/2019  Post procedure Call Back phone  # [phone]  Permission to leave phone message Yes  Some recent data might be hidden     No answer at # given.  LM on VM.

## 2019-08-18 ENCOUNTER — Encounter: Payer: Self-pay | Admitting: Gastroenterology

## 2019-12-11 ENCOUNTER — Ambulatory Visit: Payer: Self-pay | Admitting: Osteopathic Medicine

## 2019-12-12 ENCOUNTER — Ambulatory Visit (INDEPENDENT_AMBULATORY_CARE_PROVIDER_SITE_OTHER): Payer: No Typology Code available for payment source | Admitting: Osteopathic Medicine

## 2019-12-12 ENCOUNTER — Encounter: Payer: Self-pay | Admitting: Osteopathic Medicine

## 2019-12-12 VITALS — BP 158/99 | HR 61 | Wt 244.0 lb

## 2019-12-12 DIAGNOSIS — F17210 Nicotine dependence, cigarettes, uncomplicated: Secondary | ICD-10-CM | POA: Diagnosis not present

## 2019-12-12 DIAGNOSIS — E119 Type 2 diabetes mellitus without complications: Secondary | ICD-10-CM | POA: Diagnosis not present

## 2019-12-12 LAB — POCT GLYCOSYLATED HEMOGLOBIN (HGB A1C)
HbA1c POC (<> result, manual entry): 6.5 % (ref 4.0–5.6)
HbA1c, POC (controlled diabetic range): 6.5 % (ref 0.0–7.0)
HbA1c, POC (prediabetic range): 6.5 % — AB (ref 5.7–6.4)
Hemoglobin A1C: 6.5 % — AB (ref 4.0–5.6)

## 2019-12-12 MED ORDER — NICOTINE 7 MG/24HR TD PT24
7.0000 mg | MEDICATED_PATCH | Freq: Every day | TRANSDERMAL | 1 refills | Status: DC
Start: 2019-12-12 — End: 2022-06-03

## 2019-12-12 MED ORDER — NICOTINE 14 MG/24HR TD PT24: 14.0000 mg | MEDICATED_PATCH | Freq: Every day | TRANSDERMAL | 1 refills | Status: DC

## 2019-12-12 NOTE — Progress Notes (Signed)
HPI: Morgan Garza is a 59 y.o. female who  has a past medical history of Anemia, Arthritis, Diabetes mellitus without complication ([place]), History of colon polyps, Hyperlipidemia, Hypertension, and Obesity.  she presents to [facility] today, 12/12/19,  for chief complaint of: Recheck A1c, smoking cessation  Patient reports she has been doing well since her last visit. She has been keeping up with diet and exercise. She has noted increasing blurry vision, but states she plans to follow up with her ophthalmologist. She wants to quit smoking and requested a prescription for nicotine patches be sent. She has been smoking 1/2 ppd.   Past medical, surgical, social and family history reviewed:  Patient Active Problem List   Diagnosis Date Noted  . Cervicalgia 04/06/2018  . Closed head injury with concussion 04/06/2018  . Fall (on) (from) other stairs and steps, initial encounter 04/06/2018  . Cigarette nicotine dependence without complication 18/56/3149  . Sebaceous cyst of labia 02/06/2018  . Mass of axilla, left 02/04/2018  . Postmenopausal 02/02/2018  . History of colon polyps   . Allergic rhinitis 09/04/2015  . BMI 39.0-39.9,adult 09/04/2015  . Hypertension associated with diabetes ([place]) 09/04/2015  . Hyperlipidemia 09/04/2015  . Type 2 diabetes mellitus without complication, without long-term current use of insulin ([place]) 04/26/2012  . Sciatica 04/22/2012    Past Surgical History:  Procedure Laterality Date  . COLONOSCOPY  07/07/2012   [facility]  . NO PAST SURGERIES      Social History   Tobacco Use  . Smoking status: Current Every Day Smoker    Packs/day: 0.50    Years: 1.00    Pack years: 0.50    Types: Cigarettes    Last attempt to quit: 03/30/2018    Years since quitting: 1.7  . Smokeless tobacco: Never Used  Substance Use Topics  . Alcohol use: Yes    Comment: ocassionally    Family History  Problem  Relation Age of Onset  . Hypertension Mother   . COPD Mother   . Cancer Father   . Gastric cancer Father   . Testicular cancer Son   . Diabetes Son   . Diabetes Daughter   . Esophageal cancer Neg Hx   . Colon cancer Neg Hx      Current medication list and allergy/intolerance information reviewed:    Current Outpatient Medications  Medication Sig Dispense Refill  . amLODipine (NORVASC) 10 MG tablet Take 1 tablet (10 mg total) by mouth daily. 90 tablet 3  . atorvastatin (LIPITOR) 20 MG tablet Take 1 tablet (20 mg total) by mouth daily. 90 tablet 3  . Cholecalciferol (VITAMIN D3) 5000 units TABS Take by mouth.    . gabapentin (NEURONTIN) 300 MG capsule One tab PO qHS for a week, then BID for a week, then TID. May double weekly to a max of 3,600mg /day 180 capsule 3  . glipiZIDE (GLUCOTROL XL) 10 MG 24 hr tablet Take 1 tablet (10 mg total) by mouth daily with breakfast. 90 tablet 3  . glucose blood (PRECISION QID TEST) test strip Use one strip to test blood sugar.    . lisinopril-hydrochlorothiazide (ZESTORETIC) 20-25 MG tablet Take 1 tablet by mouth daily. 90 tablet 3  . metFORMIN (GLUCOPHAGE-XR) 500 MG 24 hr tablet Take 2 tablets (1,000 mg total) by mouth daily with breakfast. 180 tablet 3  . nicotine (NICODERM CQ) 14 mg/24hr patch Place 1 patch (14 mg total) onto the skin daily. 42 patch 1  .  nicotine (NICODERM CQ) 7 mg/24hr patch Place 1 patch (7 mg total) onto the skin daily. 42 patch 1   No current facility-administered medications for this visit.    Allergies  Allergen Reactions  . Bupropion Hives       . Varenicline Other (See Comments)    Vision changes, "everything was enhanced"      Review of Systems:  Constitutional:  No  fever, no chills  HEENT: No  headache, + vision change  Cardiac: No  chest pain  Respiratory:  No  shortness of breath. No  Cough  Gastrointestinal: No  abdominal pain, No  nausea, No  vomiting  Musculoskeletal: No new  myalgia/arthralgia  Skin: No  Rash, No other wounds/concerning lesions  Endocrine: No polyuria/polydipsia/polyphagia   Neurologic: No  weakness, No  dizziness  Psychiatric: No  concerns with depression  Exam:  BP (!) 158/99 (BP Location: Right Arm, Patient Position: Sitting)   Pulse 61   Wt 244 lb (110.7 kg)   LMP 11/09/2010   SpO2 98%   BMI 37.10 kg/m   Constitutional: VS see above. General Appearance: alert, well-developed, well-nourished, NAD  Eyes: Normal lids and conjunctive, non-icteric sclera   Neck: No masses, trachea midline.   Respiratory: Normal respiratory effort. no wheeze, no rhonchi, no rales  Cardiovascular: S1/S2 normal, no murmur, no rub/gallop auscultated. RRR. No lower extremity edema. Pedal pulse II/IV bilaterally DP and PT.  Gastrointestinal: Nontender, no masses.  Musculoskeletal: Gait normal. No clubbing/cyanosis of digits.   Diabetic foot exam- no ulcers/trauma. Normal monofilament test. DP/PT pulses intact.  Neurological: Normal balance/coordination. No tremor. No cranial nerve deficit on limited exam.    Skin: warm, dry, intact. No rash/ulcer. No concerning nevi or subq nodules on limited exam.  Psychiatric: Normal judgment/insight. Normal mood and affect. Oriented x3.    Results for orders placed or performed in visit on 12/12/19 (from the past 72 hour(s))  POCT HgB A1C     Status: Abnormal   Collection Time: 12/12/19  1:40 PM  Result Value Ref Range   Hemoglobin A1C 6.5 (A) 4.0 - 5.6 %   HbA1c POC (<> result, manual entry) 6.5 4.0 - 5.6 %   HbA1c, POC (prediabetic range) 6.5 (A) 5.7 - 6.4 %   HbA1c, POC (controlled diabetic range) 6.5 0.0 - 7.0 %    No results found.   ASSESSMENT/PLAN: The primary encounter diagnosis was Type 2 diabetes mellitus without complication, without long-term current use of insulin ([place]). A diagnosis of Cigarette nicotine dependence without complication was also pertinent to this visit.   Type 2 Diabetes  [place]  A1c 6.5 today (down from 6.7 in March)  Continue current regimen  Hypertension  BP 158/99, repeat 145/85. 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  Monitor 1-2x per day at home. Follow up if measures consistently >130/80.  Continue lisinopril-HCTZ  Smoking Cessation  Prescribed nicoderm patches 14mg  QD. Also sent 7mg  patches for when patient is ready to scale down.  Routine Health Maintenance  Up to date on screenings and vaccinations.  Follow up in 6 months for annual. Call the week before for labs.  Orders Placed This Encounter  Procedures  . POCT HgB A1C    Meds ordered this encounter  Medications  . nicotine (NICODERM CQ) 14 mg/24hr patch    Sig: Place 1 patch (14 mg total) onto the skin daily.    Dispense:  42 patch    Refill:  1  .  nicotine (NICODERM CQ) 7 mg/24hr patch    Sig: Place 1 patch (7 mg total) onto the skin daily.    Dispense:  42 patch    Refill:  1    Patient Instructions  Goal BP 130/80 or less - keep an eye on this! If higher than 130/80 consistently, we may need to adjust Rx          Visit summary with medication list and pertinent instructions was printed for patient to review. All questions at time of visit were answered - patient instructed to contact office with any additional concerns or updates. ER/RTC precautions were reviewed with the patient.   Please note: voice recognition software was used to produce this document, and typos may escape review. Please contact Dr. Sheppard Coil for any needed clarifications.     Follow-up plan: Return in about 24 weeks (around 05/28/2020) for [place] / SEE [place] SOONER IF NEEDED .  Gweneth Dimitri, [facility] MS3

## 2019-12-12 NOTE — Patient Instructions (Signed)
Goal BP 130/80 or less - keep an eye on this! If higher than 130/80 consistently, we may need to adjust Rx

## 2020-01-26 ENCOUNTER — Encounter: Payer: Self-pay | Admitting: Osteopathic Medicine

## 2020-02-07 ENCOUNTER — Ambulatory Visit (INDEPENDENT_AMBULATORY_CARE_PROVIDER_SITE_OTHER): Payer: Self-pay | Admitting: Osteopathic Medicine

## 2020-02-07 DIAGNOSIS — Z1152 Encounter for screening for COVID-19: Secondary | ICD-10-CM

## 2020-02-07 NOTE — Progress Notes (Signed)
Screening Covid test only.

## 2020-02-08 LAB — NOVEL CORONAVIRUS, NAA: SARS-CoV-2, NAA: NOT DETECTED

## 2020-02-08 LAB — SARS-COV-2, NAA 2 DAY TAT

## 2020-06-02 ENCOUNTER — Encounter: Payer: Self-pay | Admitting: Osteopathic Medicine

## 2020-06-02 DIAGNOSIS — Z780 Asymptomatic menopausal state: Secondary | ICD-10-CM

## 2020-06-02 DIAGNOSIS — E119 Type 2 diabetes mellitus without complications: Secondary | ICD-10-CM

## 2020-06-02 DIAGNOSIS — E782 Mixed hyperlipidemia: Secondary | ICD-10-CM

## 2020-06-02 DIAGNOSIS — I152 Hypertension secondary to endocrine disorders: Secondary | ICD-10-CM

## 2020-06-03 NOTE — Telephone Encounter (Signed)
Annual labs ordered for patient. Does the provider want to add any additional labs?

## 2020-06-06 ENCOUNTER — Other Ambulatory Visit: Payer: Self-pay

## 2020-06-06 ENCOUNTER — Ambulatory Visit (INDEPENDENT_AMBULATORY_CARE_PROVIDER_SITE_OTHER): Payer: Self-pay | Admitting: Osteopathic Medicine

## 2020-06-06 ENCOUNTER — Encounter: Payer: Self-pay | Admitting: Osteopathic Medicine

## 2020-06-06 VITALS — BP 163/92 | HR 61 | Temp 98.1°F | Wt 250.0 lb

## 2020-06-06 DIAGNOSIS — Z780 Asymptomatic menopausal state: Secondary | ICD-10-CM

## 2020-06-06 DIAGNOSIS — I152 Hypertension secondary to endocrine disorders: Secondary | ICD-10-CM

## 2020-06-06 DIAGNOSIS — E119 Type 2 diabetes mellitus without complications: Secondary | ICD-10-CM

## 2020-06-06 DIAGNOSIS — E782 Mixed hyperlipidemia: Secondary | ICD-10-CM

## 2020-06-06 DIAGNOSIS — E1159 Type 2 diabetes mellitus with other circulatory complications: Secondary | ICD-10-CM

## 2020-06-06 DIAGNOSIS — Z0000 Encounter for general adult medical examination without abnormal findings: Secondary | ICD-10-CM

## 2020-06-06 NOTE — Patient Instructions (Addendum)
General Preventive Care  Most recent routine screening labs: results pending.   Blood pressure goal 130/80 or less.   Tobacco: don't!   Alcohol: responsible moderation is ok for most adults - if you have concerns about your alcohol intake, please talk to me!   Exercise: as tolerated to reduce risk of cardiovascular disease and diabetes. Strength training will also prevent osteoporosis.   Mental health: if need for mental health care (medicines, counseling, other), or concerns about moods, please let me know!   Sexual / Reproductive health: if need for STD testing, or if concerns with libido/pain problems, please let me know!   Advanced Directive: Living Will and/or Healthcare Power of Attorney recommended for all adults, regardless of age or health.  Vaccines  Flu vaccine: for almost everyone, every fall.   Shingles vaccine: recommended!   Pneumonia vaccines: booster at age 52  Tetanus booster: every 10 years - due 2027.   COVID vaccine: THANKS for getting your vaccine! :) Cancer screenings   Colon cancer screening: for everyone age 61-75. Colonoscopy available for all, many people also qualify for the Cologuard stool test   Breast cancer screening: mammogram annually   Cervical cancer screening: Pap every 5 years if normal.   Lung cancer screening: CT chest every year for those aged 15 to 25 years who have a 20 pack-year smoking history and currently smoke or have quit within the past 15 years  Infection screenings  . HIV: recommended screening at least once age 24-65 . Gonorrhea/Chlamydia, other STI: screening as needed. . Hepatitis C: recommended once for everyone age 31-75 . TB: certain at-risk populations, or depending on work requirements and/or travel history Other . Bone Density Test: recommended for women at age 7

## 2020-06-06 NOTE — Progress Notes (Signed)
Morgan Garza is a 60 y.o. female who presents to  [place] at [facility]  today, 06/06/20, seeking care for the following:  . Annual physical      ASSESSMENT & PLAN with other pertinent findings:  The primary encounter diagnosis was Annual physical exam. Diagnoses of Hypertension associated with diabetes ([place]), Type 2 diabetes mellitus without complication, without long-term current use of insulin ([place]), Mixed hyperlipidemia, and Postmenopausal were also pertinent to this visit.   --> needs labs! Pt fairly sure she's had shingles vaccines Needs COVID booster  Needs take Bp Rx consistently... BP above goal today   Patient Instructions  General Preventive Care  Most recent routine screening labs: results pending.   Blood pressure goal 130/80 or less.   Tobacco: don't!   Alcohol: responsible moderation is ok for most adults - if you have concerns about your alcohol intake, please talk to me!   Exercise: as tolerated to reduce risk of cardiovascular disease and diabetes. Strength training will also prevent osteoporosis.   Mental health: if need for mental health care (medicines, counseling, other), or concerns about moods, please let me know!   Sexual / Reproductive health: if need for STD testing, or if concerns with libido/pain problems, please let me know!   Advanced Directive: Living Will and/or Healthcare Power of Attorney recommended for all adults, regardless of age or health.  Vaccines  Flu vaccine: for almost everyone, every fall.   Shingles vaccine: recommended!   Pneumonia vaccines: booster at age 22  Tetanus booster: every 10 years - due 2027.   COVID vaccine: THANKS for getting your vaccine! :) Cancer screenings   Colon cancer screening: for everyone age 42-75. Colonoscopy available for all, many people also qualify for the Cologuard stool test   Breast cancer screening: mammogram annually   Cervical  cancer screening: Pap every 5 years if normal.   Lung cancer screening: CT chest every year for those aged 6 to 85 years who have a 20 pack-year smoking history and currently smoke or have quit within the past 15 years  Infection screenings  . HIV: recommended screening at least once age 44-65 . Gonorrhea/Chlamydia, other STI: screening as needed. . Hepatitis C: recommended once for everyone age 43-75 . TB: certain at-risk populations, or depending on work requirements and/or travel history Other . Bone Density Test: recommended for women at age 5   No orders of the defined types were placed in this encounter.   No orders of the defined types were placed in this encounter.    See below for relevant physical exam findings  See below for recent lab and imaging results reviewed  Medications, allergies, PMH, PSH, SocH, FamH reviewed below    Follow-up instructions: Return in about 4 months (around 10/06/2020) for MONITOR A1C AND BLOOD PRESSURE, IN-OFFICE VISIT.                                        Exam:  BP (!) 163/92 (BP Location: Left Arm, Patient Position: Sitting, Cuff Size: Large)   Pulse 61   Temp 98.1 F (36.7 C) (Oral)   Wt 250 lb (113.4 kg)   LMP 11/09/2010   BMI 38.01 kg/m   Constitutional: VS see above. General Appearance: alert, well-developed, well-nourished, NAD  Neck: No masses, trachea midline.   Respiratory: Normal respiratory effort. no wheeze, no rhonchi, no rales  Cardiovascular: S1/S2 normal, no murmur, no rub/gallop auscultated. RRR.   Musculoskeletal: Gait normal. Symmetric and independent movement of all extremities  Abdominal: non-tender, non-distended, no appreciable organomegaly, neg Murphy's, BS WNLx4  Neurological: Normal balance/coordination. No tremor.  Skin: warm, dry, intact.   Psychiatric: Normal judgment/insight. Normal mood and affect. Oriented x3.   Current Meds  Medication Sig  .  amLODipine (NORVASC) 10 MG tablet Take 1 tablet (10 mg total) by mouth daily.  Marland Kitchen atorvastatin (LIPITOR) 20 MG tablet Take 1 tablet (20 mg total) by mouth daily.  . Cholecalciferol (VITAMIN D3) 5000 units TABS Take by mouth.  . gabapentin (NEURONTIN) 300 MG capsule One tab PO qHS for a week, then BID for a week, then TID. May double weekly to a max of 3,600mg /day  . glipiZIDE (GLUCOTROL XL) 10 MG 24 hr tablet Take 1 tablet (10 mg total) by mouth daily with breakfast.  . glucose blood test strip Use one strip to test blood sugar.  . lisinopril-hydrochlorothiazide (ZESTORETIC) 20-25 MG tablet Take 1 tablet by mouth daily.  . metFORMIN (GLUCOPHAGE-XR) 500 MG 24 hr tablet Take 2 tablets (1,000 mg total) by mouth daily with breakfast.  . nicotine (NICODERM CQ) 14 mg/24hr patch Place 1 patch (14 mg total) onto the skin daily.  . nicotine (NICODERM CQ) 7 mg/24hr patch Place 1 patch (7 mg total) onto the skin daily.    Allergies  Allergen Reactions  . Bupropion Hives       . Varenicline Other (See Comments)    Vision changes, "everything was enhanced"    Patient Active Problem List   Diagnosis Date Noted  . Cervicalgia 04/06/2018  . Closed head injury with concussion 04/06/2018  . Fall (on) (from) other stairs and steps, initial encounter 04/06/2018  . Cigarette nicotine dependence without complication 27/25/3664  . Sebaceous cyst of labia 02/06/2018  . Mass of axilla, left 02/04/2018  . Postmenopausal 02/02/2018  . History of colon polyps   . Allergic rhinitis 09/04/2015  . BMI 39.0-39.9,adult 09/04/2015  . Hypertension associated with diabetes ([place]) 09/04/2015  . Hyperlipidemia 09/04/2015  . Type 2 diabetes mellitus without complication, without long-term current use of insulin ([place]) 04/26/2012  . Sciatica 04/22/2012    Family History  Problem Relation Age of Onset  . Hypertension Mother   . COPD Mother   . Cancer Father   . Gastric cancer Father   . Testicular cancer Son   .  Diabetes Son   . Diabetes Daughter   . Esophageal cancer Neg Hx   . Colon cancer Neg Hx     Social History   Tobacco Use  Smoking Status Current Every Day Smoker  . Packs/day: 0.50  . Years: 1.00  . Pack years: 0.50  . Types: Cigarettes  . Last attempt to quit: 03/30/2018  . Years since quitting: 2.1  Smokeless Tobacco Never Used    Past Surgical History:  Procedure Laterality Date  . COLONOSCOPY  07/07/2012   [facility]  . NO PAST SURGERIES      Immunization History  Administered Date(s) Administered  . Moderna Sars-Covid-2 Vaccination 05/04/2019, 06/01/2019  . PPD Test 03/10/2019, 03/18/2020  . Pneumococcal Polysaccharide-23 10/24/2012  . Tdap 03/30/2009    No results found for this or any previous visit (from the past 2160 hour(s)).  No results found.     All questions at time of visit were answered - patient instructed to contact office with any additional concerns or updates. ER/RTC precautions were reviewed with  the patient as applicable.   Please note: manual typing as well as voice recognition software may have been used to produce this document - typos may escape review. Please contact Dr. Sheppard Coil for any needed clarifications.

## 2020-06-07 LAB — COMPLETE METABOLIC PANEL WITH GFR
AG Ratio: 1.8 (calc) (ref 1.0–2.5)
ALT: 12 U/L (ref 6–29)
AST: 14 U/L (ref 10–35)
Albumin: 4.6 g/dL (ref 3.6–5.1)
Alkaline phosphatase (APISO): 57 U/L (ref 37–153)
BUN: 11 mg/dL (ref 7–25)
CO2: 28 mmol/L (ref 20–32)
Calcium: 9.8 mg/dL (ref 8.6–10.4)
Chloride: 105 mmol/L (ref 98–110)
Creat: 0.82 mg/dL (ref 0.50–0.99)
GFR, Est African American: 90 mL/min/{1.73_m2} (ref >=60)
GFR, Est Non African American: 78 mL/min/{1.73_m2} (ref >=60)
Globulin: 2.5 g/dL (calc) (ref 1.9–3.7)
Glucose, Bld: 138 mg/dL — ABNORMAL HIGH (ref 65–99)
Potassium: 4.3 mmol/L (ref 3.5–5.3)
Sodium: 138 mmol/L (ref 135–146)
Total Bilirubin: 0.5 mg/dL (ref 0.2–1.2)
Total Protein: 7.1 g/dL (ref 6.1–8.1)

## 2020-06-07 LAB — CBC WITH DIFFERENTIAL/PLATELET
Absolute Monocytes: 363 cells/uL (ref 200–950)
Basophils Absolute: 41 cells/uL (ref 0–200)
Basophils Relative: 0.9 %
Eosinophils Absolute: 110 cells/uL (ref 15–500)
Eosinophils Relative: 2.4 %
HCT: 42 % (ref 35.0–45.0)
Hemoglobin: 13.7 g/dL (ref 11.7–15.5)
Lymphs Abs: 1950 cells/uL (ref 850–3900)
MCH: 26.5 pg — ABNORMAL LOW (ref 27.0–33.0)
MCHC: 32.6 g/dL (ref 32.0–36.0)
MCV: 81.2 fL (ref 80.0–100.0)
MPV: 10.8 fL (ref 7.5–12.5)
Monocytes Relative: 7.9 %
Neutro Abs: 2134 cells/uL (ref 1500–7800)
Neutrophils Relative %: 46.4 %
Platelets: 230 10*3/uL (ref 140–400)
RBC: 5.17 10*6/uL — ABNORMAL HIGH (ref 3.80–5.10)
RDW: 14.4 % (ref 11.0–15.0)
Total Lymphocyte: 42.4 %
WBC: 4.6 10*3/uL (ref 3.8–10.8)

## 2020-06-07 LAB — LIPID PANEL W/REFLEX DIRECT LDL
Cholesterol: 184 mg/dL (ref <200)
HDL: 83 mg/dL (ref >=50)
LDL Cholesterol (Calc): 81 mg/dL (calc)
Non-HDL Cholesterol (Calc): 101 mg/dL (calc) (ref <130)
Total CHOL/HDL Ratio: 2.2 (calc) (ref <5.0)
Triglycerides: 108 mg/dL (ref <150)

## 2020-06-07 LAB — HEMOGLOBIN A1C
Hgb A1c MFr Bld: 6.7 % of total Hgb — ABNORMAL HIGH (ref <5.7)
Mean Plasma Glucose: 146 mg/dL
eAG (mmol/L): 8.1 mmol/L

## 2020-08-10 ENCOUNTER — Other Ambulatory Visit: Payer: Self-pay | Admitting: Osteopathic Medicine

## 2020-10-07 ENCOUNTER — Other Ambulatory Visit: Payer: Self-pay

## 2020-10-07 ENCOUNTER — Encounter: Payer: Self-pay | Admitting: Osteopathic Medicine

## 2020-10-07 ENCOUNTER — Ambulatory Visit (INDEPENDENT_AMBULATORY_CARE_PROVIDER_SITE_OTHER): Payer: Self-pay | Admitting: Osteopathic Medicine

## 2020-10-07 VITALS — BP 140/90 | HR 71 | Ht 68.0 in | Wt 245.0 lb

## 2020-10-07 DIAGNOSIS — E1159 Type 2 diabetes mellitus with other circulatory complications: Secondary | ICD-10-CM

## 2020-10-07 DIAGNOSIS — E119 Type 2 diabetes mellitus without complications: Secondary | ICD-10-CM

## 2020-10-07 DIAGNOSIS — I152 Hypertension secondary to endocrine disorders: Secondary | ICD-10-CM

## 2020-10-07 DIAGNOSIS — F17200 Nicotine dependence, unspecified, uncomplicated: Secondary | ICD-10-CM

## 2020-10-07 LAB — POCT GLYCOSYLATED HEMOGLOBIN (HGB A1C): HbA1c, POC (controlled diabetic range): 6.7 % (ref 0.0–7.0)

## 2020-10-07 NOTE — Progress Notes (Signed)
Morgan Garza is a 60 y.o. female who presents to  [place] at [facility]  today, 10/07/20, seeking care for the following:  A1C 10/07/20 (today) is 6.7%. Was 6.7% on 06/06/20. Pt reports doing well, no concerns.  BP a bit above goal. 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  Still on nicotine patches and rarely smoking.       ASSESSMENT & PLAN with other pertinent findings:  The primary encounter diagnosis was Type 2 diabetes mellitus without complication, without long-term current use of insulin ([place]). Diagnoses of Hypertension associated with diabetes ([place]) and Nicotine dependence, uncomplicated, unspecified nicotine product type were also pertinent to this visit.   Stable issues BP ok Would consider increase ACE or switch to ARB  There are no Patient Instructions on file for this visit.  Orders Placed This Encounter  Procedures   POCT HgB A1C    No orders of the defined types were placed in this encounter.    See below for relevant physical exam findings  See below for recent lab and imaging results reviewed  Medications, allergies, PMH, PSH, SocH, [place] reviewed below    Follow-up instructions: Return in about 4 months (around 02/07/2021) for MONITOR A1C , SEE [place] SOONER IF NEEDED. CALL/MESSAGE W/ QUESTIONS.                                        Exam:  BP 140/90   Pulse 71   Ht 5\' 8"  (1.727 m)   Wt 245 lb (111.1 kg)   LMP 11/09/2010   BMI 37.25 kg/m  Constitutional: VS see above. General Appearance: alert, well-developed, well-nourished, NAD Neck: No masses, trachea midline.  Respiratory: Normal respiratory effort. no wheeze, no rhonchi, no rales Cardiovascular: S1/S2 normal, no murmur, no rub/gallop auscultated. RRR.  Musculoskeletal: Gait normal. Symmetric and independent movement of all extremities Neurological: Normal balance/coordination. No tremor. Skin: warm, dry,  intact.  Psychiatric: Normal judgment/insight. Normal mood and affect. Oriented x3.   No outpatient medications have been marked as taking for the 10/07/20 encounter (Office Visit) with Emeterio Reeve, DO.    Allergies  Allergen Reactions   Bupropion Hives        Varenicline Other (See Comments)    Vision changes, "everything was enhanced"    Patient Active Problem List   Diagnosis Date Noted   Cervicalgia 04/06/2018   Closed head injury with concussion 04/06/2018   Fall (on) (from) other stairs and steps, initial encounter 04/06/2018   Cigarette nicotine dependence without complication 45/62/5638   Sebaceous cyst of labia 02/06/2018   Mass of axilla, left 02/04/2018   Postmenopausal 02/02/2018   History of colon polyps    Allergic rhinitis 09/04/2015   BMI 39.0-39.9,adult 09/04/2015   Hypertension associated with diabetes ([place]) 09/04/2015   Hyperlipidemia 09/04/2015   Type 2 diabetes mellitus without complication, without long-term current use of insulin ([place]) 04/26/2012   Sciatica 04/22/2012    Family History  Problem Relation Age of Onset   Hypertension Mother    COPD Mother    Cancer Father    Gastric cancer Father    Testicular cancer Son    Diabetes Son    Diabetes Daughter    Esophageal cancer Neg Hx    Colon cancer Neg Hx     Social History   Tobacco Use  Smoking Status Every Day  Packs/day: 0.50   Years: 1.00   Pack years: 0.50   Types: Cigarettes   Last attempt to quit: 03/30/2018   Years since quitting: 2.5  Smokeless Tobacco Never    Past Surgical History:  Procedure Laterality Date   COLONOSCOPY  07/07/2012   [facility]   NO PAST SURGERIES      Immunization History  Administered Date(s) Administered   Moderna Sars-Covid-2 Vaccination 05/04/2019, 06/01/2019   PPD Test 03/10/2019, 03/18/2020   Pneumococcal Polysaccharide-23 10/24/2012   Tdap 03/30/2009    No results found for this or any previous visit (from the  past 2160 hour(s)).  No results found.     All questions at time of visit were answered - patient instructed to contact office with any additional concerns or updates. ER/RTC precautions were reviewed with the patient as applicable.   Please note: manual typing as well as voice recognition software may have been used to produce this document - typos may escape review. Please contact Dr. Sheppard Coil for any needed clarifications.

## 2020-10-10 LAB — HM MAMMOGRAPHY

## 2020-12-30 ENCOUNTER — Other Ambulatory Visit: Payer: Self-pay

## 2020-12-30 ENCOUNTER — Ambulatory Visit (INDEPENDENT_AMBULATORY_CARE_PROVIDER_SITE_OTHER): Payer: Self-pay | Admitting: Physician Assistant

## 2020-12-30 ENCOUNTER — Ambulatory Visit: Payer: Self-pay | Admitting: Physician Assistant

## 2020-12-30 DIAGNOSIS — Z23 Encounter for immunization: Secondary | ICD-10-CM

## 2020-12-30 DIAGNOSIS — Z111 Encounter for screening for respiratory tuberculosis: Secondary | ICD-10-CM

## 2021-01-02 LAB — QUANTIFERON-TB GOLD PLUS
Mitogen-NIL: >10 IU/mL
NIL: 0.07 IU/mL
QuantiFERON-TB Gold Plus: NEGATIVE
TB1-NIL: 0.02 IU/mL
TB2-NIL: 0.01 IU/mL

## 2021-01-03 NOTE — Progress Notes (Signed)
Negative TB screening

## 2021-01-10 ENCOUNTER — Encounter: Payer: Self-pay | Admitting: *Deleted

## 2021-01-31 ENCOUNTER — Ambulatory Visit: Payer: Self-pay | Admitting: Osteopathic Medicine

## 2021-06-12 IMAGING — MG DIGITAL SCREENING BILAT W/ TOMO W/ CAD
8 of 14 series · 8 of 40 positions shown · non-contrast
Comparison: Previous exam(s).

CLINICAL DATA: Screening.

EXAM:
DIGITAL SCREENING BILATERAL MAMMOGRAM WITH TOMO AND CAD

[L MLO synth-2D (1 of 2)]
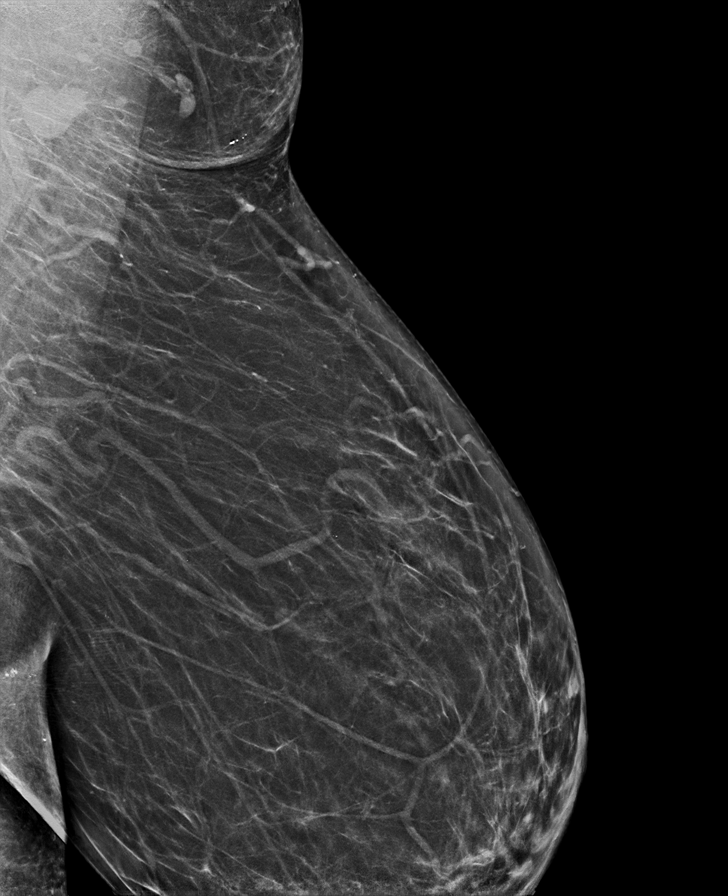

[R CC synth-2D]
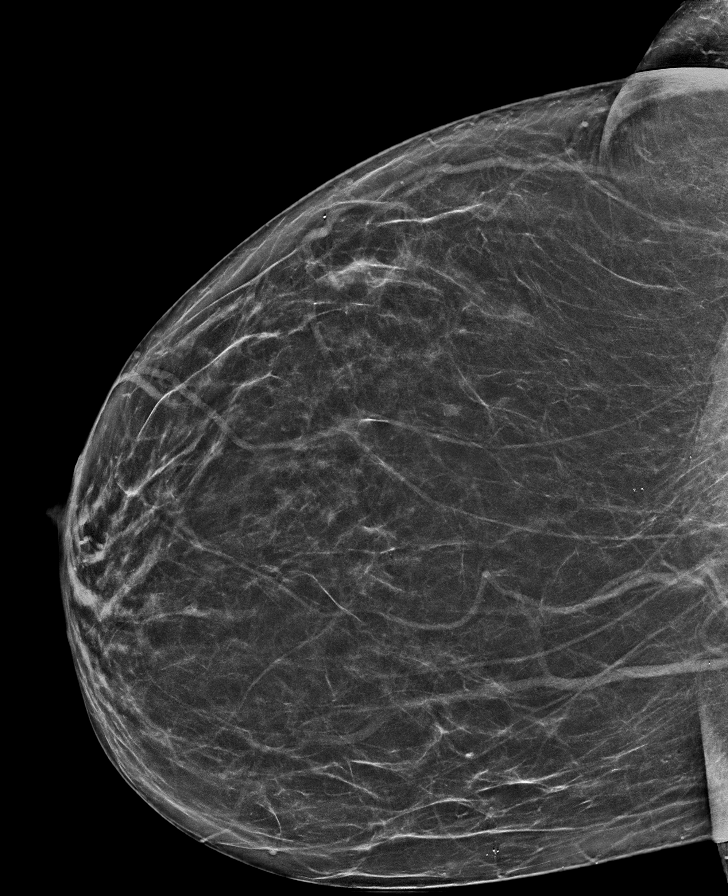

[L CC synth-2D]
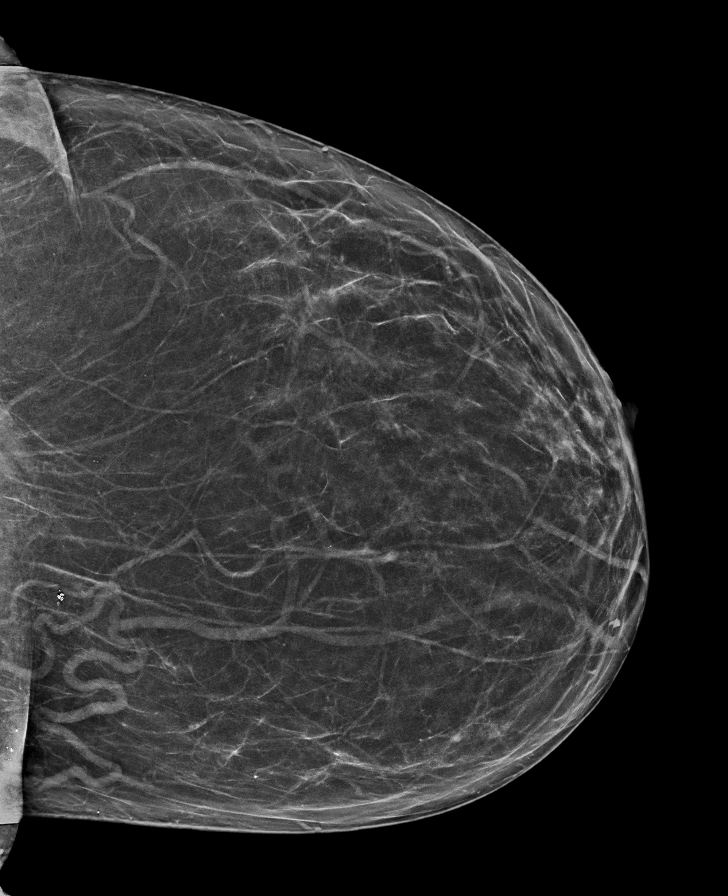

[R CV synth-2D]
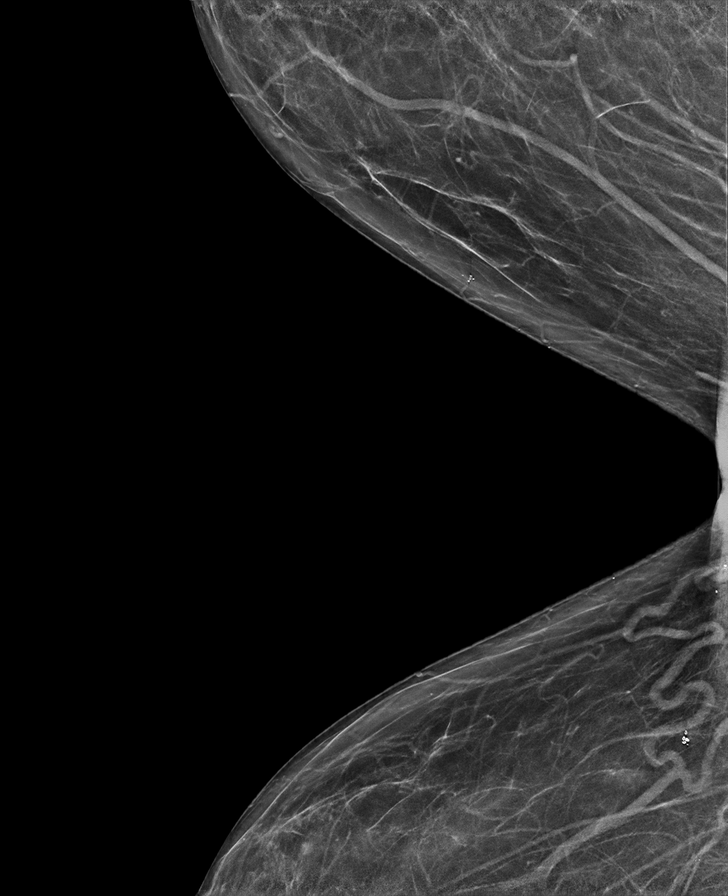

[R MLO synth-2D (1 of 2)]
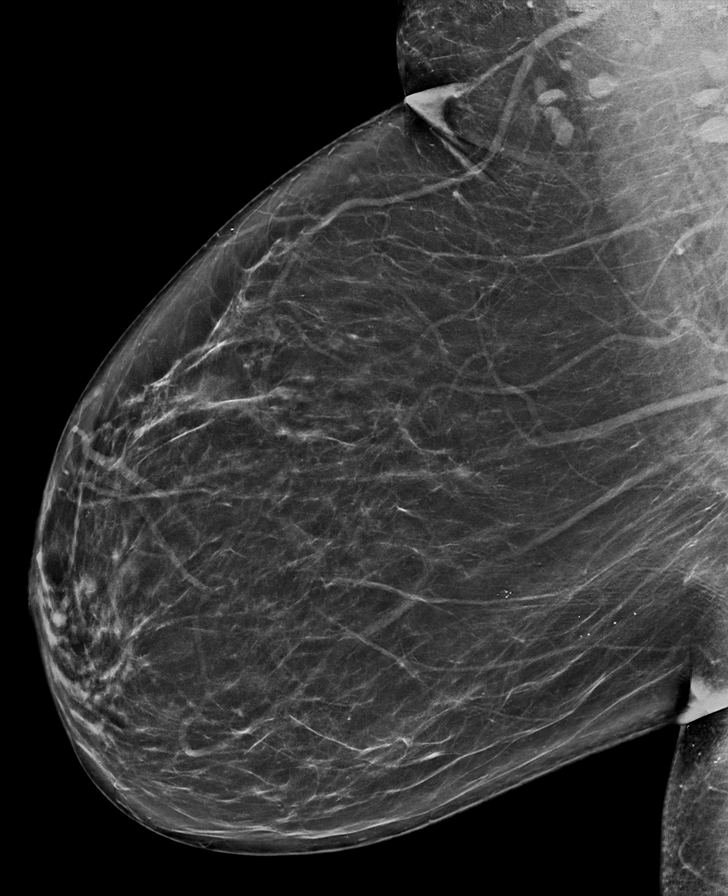

[L MLO synth-2D (2 of 2)]
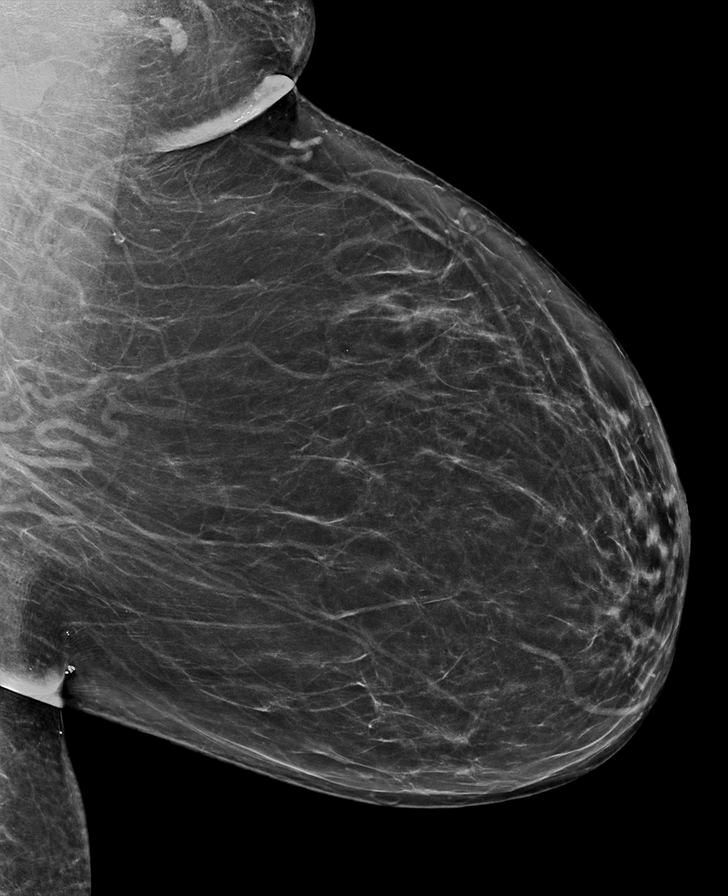

[R MLO synth-2D (2 of 2)]
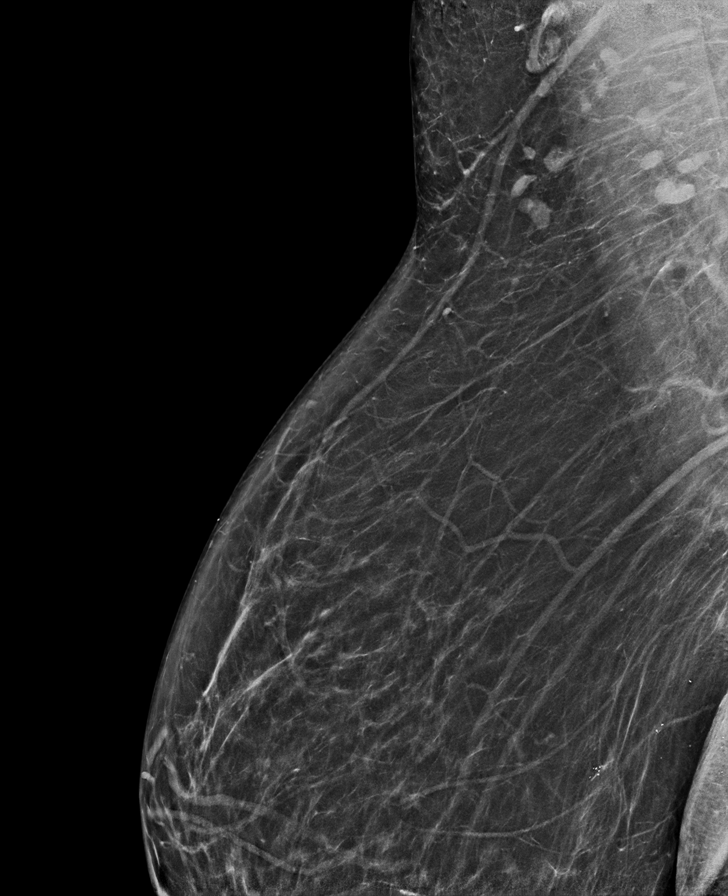

[L MLO tomo · tomo slice 42/83.0]
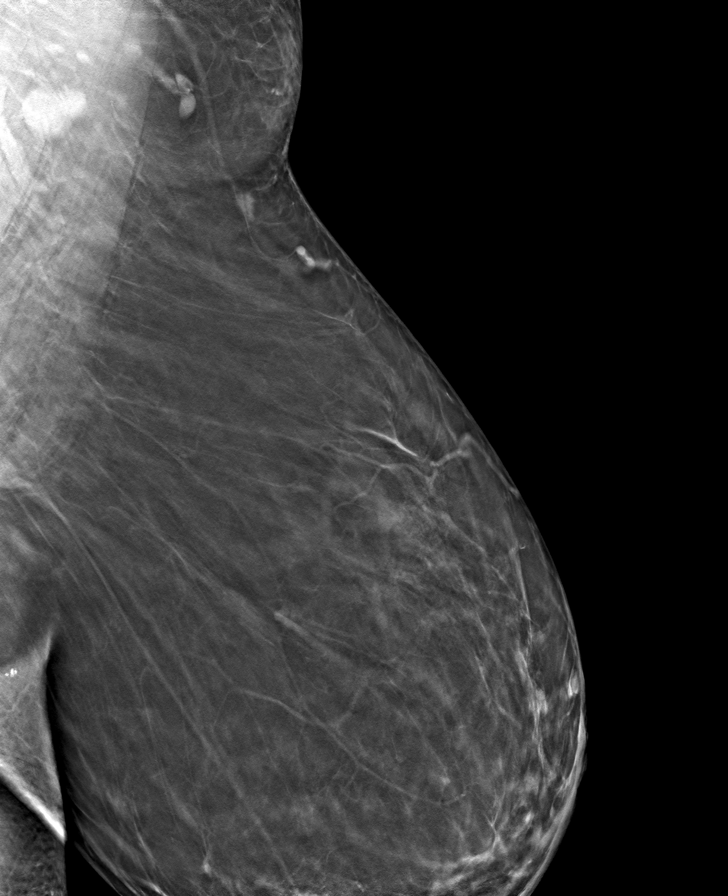

[8 of 40 positions shown; findings below may reference images not displayed]

ACR Breast Density Category b: There are scattered areas of
fibroglandular density.
FINDINGS: There are no findings suspicious for malignancy. Images were
processed with CAD.
IMPRESSION: No mammographic evidence of malignancy. A result letter of this
screening mammogram will be mailed directly to the patient.

RECOMMENDATION:
Screening mammogram in one year. (Code:[id number])

BI-RADS CATEGORY  1: Negative.

## 2022-01-06 ENCOUNTER — Ambulatory Visit (INDEPENDENT_AMBULATORY_CARE_PROVIDER_SITE_OTHER): Payer: Medicaid Other | Admitting: Family Medicine

## 2022-01-06 ENCOUNTER — Encounter: Payer: Self-pay | Admitting: Family Medicine

## 2022-01-06 VITALS — BP 132/70 | HR 70 | Ht 68.0 in | Wt 254.0 lb

## 2022-01-06 DIAGNOSIS — I10 Essential (primary) hypertension: Secondary | ICD-10-CM

## 2022-01-06 DIAGNOSIS — E119 Type 2 diabetes mellitus without complications: Secondary | ICD-10-CM | POA: Diagnosis not present

## 2022-01-06 NOTE — Assessment & Plan Note (Signed)
-   will continue current medical therapy  - ordered A1C  - she is going for her eye exam soon  - follow up in 6 months

## 2022-01-06 NOTE — Progress Notes (Signed)
Established patient visit   Patient: Morgan Garza   DOB: June 14, 1960   61 y.o. Female  MRN: [id number] Visit Date: 01/06/2022  Today's healthcare provider: Owens Loffler, DO   Chief Complaint  Patient presents with   Establish Care    SUBJECTIVE    Chief Complaint  Patient presents with   Establish Care   HPI  Pt presents for Type 2 Diabetes management. She is on the glipizide '10mg'$  and metformin '1000mg'$  BID. She has tolerated both medications well. Last A1C over a year ago was 6.7.   HTN She is currently on amlodipine '10mg'$  and lisinopril-hctz 20-25. BP 132/70. She has been tolerating medications well without issues.    Review of Systems  Constitutional:  Negative for activity change, fatigue and fever.  Respiratory:  Negative for cough and shortness of breath.   Cardiovascular:  Negative for chest pain.  Gastrointestinal:  Negative for abdominal pain.  Genitourinary:  Negative for difficulty urinating.      Current Meds  Medication Sig   amLODipine (NORVASC) 10 MG tablet TAKE ONE TABLET BY MOUTH DAILY   atorvastatin (LIPITOR) 20 MG tablet TAKE ONE TABLET BY MOUTH DAILY   Cholecalciferol (VITAMIN D3) 5000 units TABS Take 1,000 Units by mouth.   glipiZIDE (GLUCOTROL XL) 10 MG 24 hr tablet TAKE ONE TABLET BY MOUTH DAILY WITH BREAKFAST   glucose blood test strip Use one strip to test blood sugar.   lisinopril-hydrochlorothiazide (ZESTORETIC) 20-25 MG tablet TAKE ONE TABLET BY MOUTH DAILY   metFORMIN (GLUCOPHAGE-XR) 500 MG 24 hr tablet TAKE TWO TABLETS BY MOUTH EVERY MORNING WITH BREAKFAST   nicotine (NICODERM CQ) 14 mg/24hr patch Place 1 patch (14 mg total) onto the skin daily.   nicotine (NICODERM CQ) 7 mg/24hr patch Place 1 patch (7 mg total) onto the skin daily.    OBJECTIVE    BP 132/70   Pulse 70   Ht '5\' 8"'$  (1.727 m)   Wt 254 lb (115.2 kg)   LMP 11/09/2010   SpO2 100%   BMI 38.62 kg/m   Physical Exam Vitals and nursing note reviewed.   Constitutional:      General: She is not in acute distress.    Appearance: Normal appearance.  HENT:     Head: Normocephalic and atraumatic.     Right Ear: External ear normal.     Left Ear: External ear normal.     Nose: Nose normal.  Eyes:     Conjunctiva/sclera: Conjunctivae normal.  Cardiovascular:     Rate and Rhythm: Normal rate and regular rhythm.  Pulmonary:     Effort: Pulmonary effort is normal.     Breath sounds: Normal breath sounds.  Neurological:     General: No focal deficit present.     Mental Status: She is alert and oriented to person, place, and time.  Psychiatric:        Mood and Affect: Mood normal.        Behavior: Behavior normal.        Thought Content: Thought content normal.        Judgment: Judgment normal.        ASSESSMENT & PLAN    Problem List Items Addressed This Visit       Cardiovascular and Mediastinum   Primary hypertension - Primary    - BP well controlled  - continue current management - will get CBC, CMP, lipid panel      Relevant Orders   COMPLETE METABOLIC  PANEL WITH GFR   CBC     Endocrine   Type 2 diabetes mellitus without complication, without long-term current use of insulin ([name])    - will continue current medical therapy  - ordered A1C  - she is going for her eye exam soon  - follow up in 6 months      Relevant Orders   HgB A1c   Lipid panel    Return in about 6 months (around 07/08/2022).      No orders of the defined types were placed in this encounter.   Orders Placed This Encounter  Procedures   HgB A1c   COMPLETE METABOLIC PANEL WITH GFR   Lipid panel    Order Specific Question:   Has the patient fasted?    Answer:   No    Order Specific Question:   Release to patient    Answer:   Immediate   CBC     Morgan Garza, [place] [phone] (phone) [phone] (fax)  [place]

## 2022-01-06 NOTE — Assessment & Plan Note (Signed)
-   BP well controlled  - continue current management - will get CBC, CMP, lipid panel

## 2022-01-07 LAB — LIPID PANEL
Cholesterol: 187 mg/dL (ref <200)
HDL: 75 mg/dL (ref >=50)
LDL Cholesterol (Calc): 93 mg/dL (calc)
Non-HDL Cholesterol (Calc): 112 mg/dL (calc) (ref <130)
Total CHOL/HDL Ratio: 2.5 (calc) (ref <5.0)
Triglycerides: 99 mg/dL (ref <150)

## 2022-01-07 LAB — HEMOGLOBIN A1C
Hgb A1c MFr Bld: 7.2 % of total Hgb — ABNORMAL HIGH (ref <5.7)
Mean Plasma Glucose: 160 mg/dL
eAG (mmol/L): 8.9 mmol/L

## 2022-01-07 LAB — COMPLETE METABOLIC PANEL WITH GFR
AG Ratio: 1.4 (calc) (ref 1.0–2.5)
ALT: 14 U/L (ref 6–29)
AST: 16 U/L (ref 10–35)
Albumin: 4.4 g/dL (ref 3.6–5.1)
Alkaline phosphatase (APISO): 65 U/L (ref 37–153)
BUN: 19 mg/dL (ref 7–25)
CO2: 26 mmol/L (ref 20–32)
Calcium: 10.3 mg/dL (ref 8.6–10.4)
Chloride: 103 mmol/L (ref 98–110)
Creat: 0.93 mg/dL (ref 0.50–1.05)
Globulin: 3.1 g/dL (calc) (ref 1.9–3.7)
Glucose, Bld: 114 mg/dL (ref 65–139)
Potassium: 4.7 mmol/L (ref 3.5–5.3)
Sodium: 139 mmol/L (ref 135–146)
Total Bilirubin: 0.4 mg/dL (ref 0.2–1.2)
Total Protein: 7.5 g/dL (ref 6.1–8.1)
eGFR: 69 mL/min/{1.73_m2} (ref >=60)

## 2022-01-07 LAB — CBC
HCT: 41.9 % (ref 35.0–45.0)
Hemoglobin: 13.7 g/dL (ref 11.7–15.5)
MCH: 26.3 pg — ABNORMAL LOW (ref 27.0–33.0)
MCHC: 32.7 g/dL (ref 32.0–36.0)
MCV: 80.6 fL (ref 80.0–100.0)
MPV: 12.2 fL (ref 7.5–12.5)
Platelets: 258 10*3/uL (ref 140–400)
RBC: 5.2 10*6/uL — ABNORMAL HIGH (ref 3.80–5.10)
RDW: 15.1 % — ABNORMAL HIGH (ref 11.0–15.0)
WBC: 6 10*3/uL (ref 3.8–10.8)

## 2022-02-03 ENCOUNTER — Encounter: Payer: Self-pay | Admitting: Family Medicine

## 2022-02-03 DIAGNOSIS — E119 Type 2 diabetes mellitus without complications: Secondary | ICD-10-CM

## 2022-02-12 ENCOUNTER — Ambulatory Visit
Admission: EM | Admit: 2022-02-12 | Discharge: 2022-02-12 | Disposition: A | Discharge status: Home / Self Care | Payer: Medicaid Other | Attending: Family Medicine | Admitting: Family Medicine

## 2022-02-12 DIAGNOSIS — M79605 Pain in left leg: Secondary | ICD-10-CM

## 2022-02-12 DIAGNOSIS — R1310 Dysphagia, unspecified: Secondary | ICD-10-CM | POA: Diagnosis not present

## 2022-02-12 DIAGNOSIS — Z91038 Other insect allergy status: Secondary | ICD-10-CM

## 2022-02-12 MED ORDER — ACETAMINOPHEN 325 MG PO TABS
650.0000 mg | ORAL_TABLET | Freq: Once | ORAL | Status: AC
  Administered 2022-02-12: 650 mg via ORAL

## 2022-02-12 MED ORDER — DIPHENHYDRAMINE HCL 25 MG PO TABS: 25.0000 mg | ORAL_TABLET | Freq: Four times a day (QID) | ORAL | 0 refills | Status: DC | PRN

## 2022-02-12 MED ORDER — METHYLPREDNISOLONE 4 MG PO TBPK: ORAL_TABLET | ORAL | 0 refills | Status: DC

## 2022-02-12 MED ORDER — METHYLPREDNISOLONE SODIUM SUCC 125 MG IJ SOLR
125.0000 mg | Freq: Once | INTRAMUSCULAR | Status: AC
  Administered 2022-02-12: 125 mg via INTRAMUSCULAR

## 2022-02-12 NOTE — ED Notes (Signed)
[place] Dr. Meda Coffee notified of pt's s/s and enters pt's room.

## 2022-02-12 NOTE — ED Triage Notes (Addendum)
Pt presents to Urgent Care with c/o leg R leg swelling, pain, and paresthesia since this AM after being stung by a yellow jacket on R inner calf. No acute respiratory distress but states her throat is beginning to swell and she is feeling dizzy.

## 2022-02-12 NOTE — Discharge Instructions (Signed)
Take Benadryl 1 or 2 before bedtime Elevate leg and keep ice on for 20 minutes every couple hours Start the Medrol Dosepak tomorrow. See your doctor if not improving in a couple days

## 2022-02-12 NOTE — ED Notes (Signed)
Pt states breathing is unlabored and throat swelling has not increased. Left door open to observe pt and instructed to notify RN or MD of any worsening s/s.

## 2022-02-12 NOTE — ED Provider Notes (Signed)
[facility]    CSN: [id number] Arrival date & time: 02/12/22  1719      History   Chief Complaint Chief Complaint  Patient presents with   Insect Bite   Leg Pain   Dizziness   Oral Swelling    HPI Morgan Garza is a 61 y.o. female.   HPI  Patient states she got stung by yellow jacket at 10:00 this morning.  She has pictures of her leg at 1044.  She has hives all over the area, confluent, that looks like it is about 10 cm across.  Since then she has had increasing reaction.  Swelling in the leg.  Pain in the leg.  Redness and heat.  She also has pain from the right popliteal fossa up into her buttock.  She states that she is starting to have some tingling in her fingertips.  She feels like she might have trouble swallowing.  This feeling comes and goes.  Has never been allergic to stings before.  Is not having any problems wheezing, speaking  Past Medical History:  Diagnosis Date   Anemia    Arthritis    Diabetes mellitus without complication ([facility])    History of colon polyps    Hyperlipidemia    Hypertension    Obesity     Patient Active Problem List   Diagnosis Date Noted   Cervicalgia 04/06/2018   Closed head injury with concussion 04/06/2018   Fall (on) (from) other stairs and steps, initial encounter 04/06/2018   Cigarette nicotine dependence without complication 35/00/9381   Sebaceous cyst of labia 02/06/2018   Mass of axilla, left 02/04/2018   Postmenopausal 02/02/2018   History of colon polyps    Allergic rhinitis 09/04/2015   BMI 39.0-39.9,adult 09/04/2015   Primary hypertension 09/04/2015   Hyperlipidemia 09/04/2015   Type 2 diabetes mellitus without complication, without long-term current use of insulin ([place]) 04/26/2012   Sciatica 04/22/2012    Past Surgical History:  Procedure Laterality Date   COLONOSCOPY  07/07/2012   [facility]   NO PAST SURGERIES      OB History     Gravida  6   Para  4   Term       Preterm      AB  2   Living  4      SAB  1   IAB  1   Ectopic      Multiple      Live Births               Home Medications    Prior to Admission medications   Medication Sig Start Date End Date Taking? Authorizing Provider  diphenhydrAMINE (BENADRYL) 25 MG tablet Take 1 tablet (25 mg total) by mouth every 6 (six) hours as needed. 02/12/22  Yes Raylene Everts, MD  methylPREDNISolone (MEDROL DOSEPAK) 4 MG TBPK tablet tad 02/12/22  Yes Raylene Everts, MD  amLODipine (NORVASC) 10 MG tablet TAKE ONE TABLET BY MOUTH DAILY 08/13/20   Emeterio Reeve, DO  atorvastatin (LIPITOR) 20 MG tablet TAKE ONE TABLET BY MOUTH DAILY 08/13/20   Emeterio Reeve, DO  Cholecalciferol (VITAMIN D3) 5000 units TABS Take 1,000 Units by mouth.    [provider]  glipiZIDE (GLUCOTROL XL) 10 MG 24 hr tablet TAKE ONE TABLET BY MOUTH DAILY WITH BREAKFAST 08/13/20   Emeterio Reeve, DO  glucose blood test strip Use one strip to test blood sugar. 09/03/16   [provider]  lisinopril-hydrochlorothiazide (ZESTORETIC) 20-25 MG tablet TAKE ONE TABLET BY MOUTH DAILY 08/13/20   Emeterio Reeve, DO  metFORMIN (GLUCOPHAGE-XR) 500 MG 24 hr tablet TAKE TWO TABLETS BY MOUTH EVERY MORNING WITH BREAKFAST 08/13/20   Emeterio Reeve, DO  nicotine (NICODERM CQ) 14 mg/24hr patch Place 1 patch (14 mg total) onto the skin daily. 12/12/19   Emeterio Reeve, DO  nicotine (NICODERM CQ) 7 mg/24hr patch Place 1 patch (7 mg total) onto the skin daily. 12/12/19   Emeterio Reeve, DO    Family History Family History  Problem Relation Age of Onset   Hypertension Mother    COPD Mother    Cancer Father    Gastric cancer Father    Diabetes Daughter    Testicular cancer Son    Diabetes Son    Esophageal cancer Neg Hx    Colon cancer Neg Hx     Social History Social History   Tobacco Use   Smoking status: Every Day    Packs/day: 0.50    Years: 1.00    Total pack years: 0.50     Types: Cigarettes   Smokeless tobacco: Never  Vaping Use   Vaping Use: Never used  Substance Use Topics   Alcohol use: Yes    Comment: ocassionally   Drug use: Never     Allergies   Bupropion and Varenicline   Review of Systems Review of Systems  See HPI Physical Exam Triage Vital Signs ED Triage Vitals  Enc Vitals Group     BP 02/12/22 1729 (!) 154/90     Pulse Rate 02/12/22 1729 73     Resp 02/12/22 1733 16     Temp 02/12/22 1729 98.7 F (37.1 C)     Temp Source 02/12/22 1729 Oral     SpO2 02/12/22 1729 98 %     Weight 02/12/22 1744 250 lb (113.4 kg)     Height 02/12/22 1744 '5\' 8"'$  (1.727 m)     Head Circumference --      Peak Flow --      Pain Score 02/12/22 1737 9     Pain Loc --      Pain Edu? --      Excl. in [place]? --    No data found.  Updated Vital Signs BP (!) 145/85 (BP Location: Left Arm)   Pulse 73   Temp 98.7 F (37.1 C) (Oral)   Resp 16   Ht '5\' 8"'$  (1.727 m)   Wt 113.4 kg   LMP 11/09/2010   SpO2 98%   BMI 38.01 kg/m       Physical Exam Constitutional:      General: She is not in acute distress.    Appearance: She is well-developed. She is obese. She is ill-appearing.  HENT:     Head: Normocephalic and atraumatic.  Eyes:     Conjunctiva/sclera: Conjunctivae normal.     Pupils: Pupils are equal, round, and reactive to light.  Cardiovascular:     Rate and Rhythm: Normal rate.  Pulmonary:     Effort: Pulmonary effort is normal. No respiratory distress.     Breath sounds: Normal breath sounds.  Abdominal:     General: There is no distension.     Palpations: Abdomen is soft.  Musculoskeletal:        General: Normal range of motion.     Cervical back: Normal range of motion.       Legs:     Comments: The entire lower leg from  the tibial tuberosity to the ankle circumferentially is tender.  Straight leg raise is negative.  Skin:    General: Skin is warm and dry.  Neurological:     General: No focal deficit present.     Mental  Status: She is alert.  Psychiatric:        Mood and Affect: Mood normal.        Behavior: Behavior normal.     Comments: Mildly anxious      UC Treatments / Results  Labs (all labs ordered are listed, but only abnormal results are displayed) Labs Reviewed - No data to display  EKG   Radiology No results found.  Procedures Procedures (including critical care time)  Medications Ordered in UC Medications  methylPREDNISolone sodium succinate (SOLU-MEDROL) 125 mg/2 mL injection 125 mg (125 mg Intramuscular Given 02/12/22 1743)  acetaminophen (TYLENOL) tablet 650 mg (650 mg Oral Given 02/12/22 1819)    Initial Impression / Assessment and Plan / UC Course  I have reviewed the triage vital signs and the nursing notes.  Pertinent labs & imaging results that were available during my care of the patient were reviewed by me and considered in my medical decision making (see chart for details).     Patient was given a shot of Solu-Medrol.  We discussed Ed adrenaline shot and travel to the ER by ambulance.  Patient declines.  Once reassured she did well after the Medrol shot.  Started to see improvement.  Was sent home on steroids.  I believe the the feeling that she could not swallow was globus hystericus and the numbness in the fingers was from anxiety.  She does have a local allergic reaction to insect sting which is treated with steroids and Benadryl Final Clinical Impressions(s) / UC Diagnoses   Final diagnoses:  Allergic reaction to insect bite  Dysphagia, unspecified type  Acute leg pain, left     Discharge Instructions      Take Benadryl 1 or 2 before bedtime Elevate leg and keep ice on for 20 minutes every couple hours Start the Medrol Dosepak tomorrow. See your doctor if not improving in a couple days   ED Prescriptions     Medication Sig Dispense Auth. Provider   methylPREDNISolone (MEDROL DOSEPAK) 4 MG TBPK tablet tad 21 tablet Raylene Everts, MD    diphenhydrAMINE (BENADRYL) 25 MG tablet Take 1 tablet (25 mg total) by mouth every 6 (six) hours as needed. 30 tablet Raylene Everts, MD      PDMP not reviewed this encounter.   Raylene Everts, MD 02/12/22 [phone]

## 2022-02-13 ENCOUNTER — Telehealth: Payer: Self-pay | Admitting: Emergency Medicine

## 2022-02-23 ENCOUNTER — Other Ambulatory Visit: Payer: Self-pay | Admitting: Osteopathic Medicine

## 2022-02-23 MED ORDER — ONETOUCH ULTRA 2 W/DEVICE KIT: PACK | 0 refills | Status: DC

## 2022-02-23 MED ORDER — ONETOUCH ULTRA VI STRP: ORAL_STRIP | 99 refills | Status: DC

## 2022-02-23 MED ORDER — LANCETS 30G MISC: 99 refills | Status: DC

## 2022-02-25 ENCOUNTER — Other Ambulatory Visit: Payer: Self-pay | Admitting: Family Medicine

## 2022-02-25 DIAGNOSIS — Z1231 Encounter for screening mammogram for malignant neoplasm of breast: Secondary | ICD-10-CM

## 2022-04-15 ENCOUNTER — Ambulatory Visit: Payer: Medicaid Other

## 2022-04-27 DIAGNOSIS — Z0000 Encounter for general adult medical examination without abnormal findings: Secondary | ICD-10-CM | POA: Insufficient documentation

## 2022-05-07 ENCOUNTER — Encounter: Payer: Self-pay | Admitting: Emergency Medicine

## 2022-05-07 ENCOUNTER — Ambulatory Visit (INDEPENDENT_AMBULATORY_CARE_PROVIDER_SITE_OTHER): Payer: Medicaid Other

## 2022-05-07 ENCOUNTER — Ambulatory Visit
Admission: EM | Admit: 2022-05-07 | Discharge: 2022-05-07 | Disposition: A | Discharge status: Home / Self Care | Payer: Medicaid Other | Attending: Physician Assistant | Admitting: Physician Assistant

## 2022-05-07 DIAGNOSIS — R051 Acute cough: Secondary | ICD-10-CM

## 2022-05-07 DIAGNOSIS — R059 Cough, unspecified: Secondary | ICD-10-CM

## 2022-05-07 NOTE — ED Provider Notes (Signed)
[name]    CSN: [id number] Arrival date & time: 05/07/22  1339      History   Chief Complaint Chief Complaint  Patient presents with   Cough    HPI Morgan Garza is a 62 y.o. female.   Patient is concerned that she has pneumonia.  Patient complains of a cough and congestion she reports that she had pneumonia in 2023 and had a difficult time recovering.  Patient reports she is also currently being evaluated for a pulmonary nodule she is scheduled to have a PET scan and is concerned that she could have infection that we will cause abnormality on her scan patient denies any current fever  The history is provided by the patient.  Cough Cough characteristics:  Non-productive Sputum characteristics:  Nondescript   Past Medical History:  Diagnosis Date   Anemia    Arthritis    Diabetes mellitus without complication ([place])    History of colon polyps    Hyperlipidemia    Hypertension    Obesity     Patient Active Problem List   Diagnosis Date Noted   Cervicalgia 04/06/2018   Closed head injury with concussion 04/06/2018   Fall (on) (from) other stairs and steps, initial encounter 04/06/2018   Cigarette nicotine dependence without complication 19/37/9024   Sebaceous cyst of labia 02/06/2018   Mass of axilla, left 02/04/2018   Postmenopausal 02/02/2018   History of colon polyps    Allergic rhinitis 09/04/2015   BMI 39.0-39.9,adult 09/04/2015   Primary hypertension 09/04/2015   Hyperlipidemia 09/04/2015   Type 2 diabetes mellitus without complication, without long-term current use of insulin ([place]) 04/26/2012   Sciatica 04/22/2012    Past Surgical History:  Procedure Laterality Date   COLONOSCOPY  07/07/2012   [facility]   NO PAST SURGERIES      OB History     Gravida  6   Para  4   Term      Preterm      AB  2   Living  4      SAB  1   IAB  1   Ectopic      Multiple      Live Births               Home  Medications    Prior to Admission medications   Medication Sig Start Date End Date Taking? Authorizing Provider  amLODipine (NORVASC) 10 MG tablet TAKE ONE TABLET BY MOUTH DAILY 02/23/22  Yes Mel Almond, Erika S, DO  atorvastatin (LIPITOR) 20 MG tablet TAKE ONE TABLET BY MOUTH DAILY 02/23/22  Yes Mel Almond, Erika S, DO  Blood Glucose Monitoring Suppl (ONE TOUCH ULTRA 2) w/Device KIT Dx DM E11.8. Check blood sugar 3 times daily. 02/23/22  Yes Owens Loffler, DO  Cholecalciferol (VITAMIN D3) 5000 units TABS Take 1,000 Units by mouth.   Yes [provider]  diphenhydrAMINE (BENADRYL) 25 MG tablet Take 1 tablet (25 mg total) by mouth every 6 (six) hours as needed. 02/12/22  Yes Raylene Everts, MD  glipiZIDE (GLUCOTROL XL) 10 MG 24 hr tablet TAKE ONE TABLET BY MOUTH DAILY WITH BREAKFAST 02/23/22  Yes Mel Almond, Erika S, DO  glucose blood (ONETOUCH ULTRA) test strip Dx DM E11.8. Check blood sugar 3 times daily. 02/23/22  Yes Elmo Putt S, DO  glucose blood test strip Use one strip to test blood sugar. 09/03/16  Yes [provider]  Lancets 30G MISC Dx DM E11.8. Check  blood sugar 3 times daily. 02/23/22  Yes Mel Almond, Erika S, DO  lisinopril-hydrochlorothiazide (ZESTORETIC) 20-25 MG tablet TAKE ONE TABLET BY MOUTH DAILY 02/23/22  Yes Mel Almond, Erika S, DO  metFORMIN (GLUCOPHAGE-XR) 500 MG 24 hr tablet TAKE TWO TABLETS BY MOUTH EVERY MORNING WITH BREAKFAST 02/23/22  Yes Owens Loffler, DO  methylPREDNISolone (MEDROL DOSEPAK) 4 MG TBPK tablet tad 02/12/22  Yes Raylene Everts, MD  nicotine (NICODERM CQ) 14 mg/24hr patch Place 1 patch (14 mg total) onto the skin daily. 12/12/19  Yes Emeterio Reeve, DO  nicotine (NICODERM CQ) 7 mg/24hr patch Place 1 patch (7 mg total) onto the skin daily. 12/12/19  Yes Emeterio Reeve, DO    Family History Family History  Problem Relation Age of Onset   Hypertension Mother    COPD Mother    Cancer Father    Gastric cancer Father    Diabetes Daughter     Testicular cancer Son    Diabetes Son    Esophageal cancer Neg Hx    Colon cancer Neg Hx     Social History Social History   Tobacco Use   Smoking status: Every Day    Packs/day: 0.50    Years: 1.00    Total pack years: 0.50    Types: Cigarettes   Smokeless tobacco: Never  Vaping Use   Vaping Use: Never used  Substance Use Topics   Alcohol use: Yes    Comment: ocassionally   Drug use: Never     Allergies   Bupropion and Varenicline   Review of Systems Review of Systems  Respiratory:  Positive for cough.   All other systems reviewed and are negative.    Physical Exam Triage Vital Signs ED Triage Vitals  Enc Vitals Group     BP 05/07/22 1411 128/84     Pulse Rate 05/07/22 1411 69     Resp 05/07/22 1411 18     Temp 05/07/22 1411 98.6 F (37 C)     Temp Source 05/07/22 1411 Oral     SpO2 05/07/22 1411 96 %     Weight 05/07/22 1413 250 lb (113.4 kg)     Height 05/07/22 1413 '5\' 8"'$  (1.727 m)     Head Circumference --      Peak Flow --      Pain Score 05/07/22 1413 0     Pain Loc --      Pain Edu? --      Excl. in [place]? --    No data found.  Updated Vital Signs BP 128/84 (BP Location: Right Arm)   Pulse 69   Temp 98.6 F (37 C) (Oral)   Resp 18   Ht '5\' 8"'$  (1.727 m)   Wt 113.4 kg   LMP 11/09/2010   SpO2 96%   BMI 38.01 kg/m   Visual Acuity Right Eye Distance:   Left Eye Distance:   Bilateral Distance:    Right Eye Near:   Left Eye Near:    Bilateral Near:     Physical Exam Vitals and nursing note reviewed.  Constitutional:      Appearance: She is well-developed.  HENT:     Head: Normocephalic.  Cardiovascular:     Rate and Rhythm: Normal rate.  Pulmonary:     Effort: Pulmonary effort is normal.  Abdominal:     General: There is no distension.  Musculoskeletal:        General: Normal range of motion.     Cervical back: Normal  range of motion.  Skin:    General: Skin is warm.  Neurological:     General: No focal deficit present.      Mental Status: She is alert and oriented to person, place, and time.      UC Treatments / Results  Labs (all labs ordered are listed, but only abnormal results are displayed) Labs Reviewed - No data to display  EKG   Radiology DG Chest 2 View  Result Date: 05/07/2022 CLINICAL DATA:  Cough EXAM: CHEST - 2 VIEW COMPARISON:  None Available. FINDINGS: The heart size and mediastinal contours are within normal limits. Both lungs are clear. The visualized skeletal structures are unremarkable. IMPRESSION: No active cardiopulmonary disease. Electronically Signed   By: Davina Poke D.O.   On: 05/07/2022 14:51    Procedures Procedures (including critical care time)  Medications Ordered in UC Medications - No data to display  Initial Impression / Assessment and Plan / UC Course  I have reviewed the triage vital signs and the nursing notes.  Pertinent labs & imaging results that were available during my care of the patient were reviewed by me and considered in my medical decision making (see chart for details).     MDM: Chest x-ray obtained at patient's request chest x-ray shows no evidence of pneumonia I am unable to visualize any pulmonary nodule radiologist does not comment on pulmonary nodule.  Patient is advised symptomatic care follow-up with her pulmonologist as scheduled Final Clinical Impressions(s) / UC Diagnoses   Final diagnoses:  Acute cough     Discharge Instructions      Follow up with your Pulmonary Physician as scheduled. Your chest xray does not show any sign of pneumonia    ED Prescriptions   None    An After Visit Summary was printed and given to the patient.     PDMP not reviewed this encounter.   Fransico Meadow, [place] 05/07/22 [phone]

## 2022-05-07 NOTE — ED Triage Notes (Signed)
Patient c/o non-productive cough, chest congestion, nasal drainage, body chills and headache x 1 week.  Patient is concerned for pneumonia.  Patient has taken Thera-Flu.

## 2022-05-07 NOTE — Discharge Instructions (Addendum)
Follow up with your Pulmonary Physician as scheduled. Your chest xray does not show any sign of pneumonia

## 2022-05-08 ENCOUNTER — Telehealth: Payer: Self-pay | Admitting: Emergency Medicine

## 2022-05-08 NOTE — Telephone Encounter (Signed)
Spoke with patient states that she is doing much better.  Will continue and follow up as needed.

## 2022-06-01 LAB — HM DIABETES EYE EXAM

## 2022-06-03 ENCOUNTER — Ambulatory Visit (INDEPENDENT_AMBULATORY_CARE_PROVIDER_SITE_OTHER): Payer: Medicaid Other

## 2022-06-03 ENCOUNTER — Ambulatory Visit
Admission: EM | Admit: 2022-06-03 | Discharge: 2022-06-03 | Disposition: A | Discharge status: Home / Self Care | Payer: Medicaid Other | Attending: Family Medicine | Admitting: Family Medicine

## 2022-06-03 DIAGNOSIS — M1711 Unilateral primary osteoarthritis, right knee: Secondary | ICD-10-CM

## 2022-06-03 DIAGNOSIS — M25461 Effusion, right knee: Secondary | ICD-10-CM | POA: Diagnosis not present

## 2022-06-03 DIAGNOSIS — S8391XA Sprain of unspecified site of right knee, initial encounter: Secondary | ICD-10-CM

## 2022-06-03 DIAGNOSIS — M25561 Pain in right knee: Secondary | ICD-10-CM

## 2022-06-03 MED ORDER — TRAMADOL HCL 50 MG PO TABS: 50.0000 mg | ORAL_TABLET | Freq: Four times a day (QID) | ORAL | 0 refills | Status: DC | PRN

## 2022-06-03 MED ORDER — ACETAMINOPHEN 325 MG PO TABS
650.0000 mg | ORAL_TABLET | Freq: Once | ORAL | Status: AC
  Administered 2022-06-03: 650 mg via ORAL

## 2022-06-03 MED ORDER — NAPROXEN 375 MG PO TABS: 375.0000 mg | ORAL_TABLET | Freq: Two times a day (BID) | ORAL | 0 refills | Status: DC

## 2022-06-03 NOTE — Discharge Instructions (Addendum)
Limit walking while knee painful Take the naproxen for knee swelling and inflammation Take the tramadol for knee pain Continue ice See Dr T in follow up

## 2022-06-03 NOTE — ED Notes (Signed)
ICE AND TYLENOL PROVIDED AFTER TRIAGE COMPLETED

## 2022-06-03 NOTE — ED Provider Notes (Signed)
[name]    CSN: [id number] Arrival date & time: 06/03/22  1226      History   Chief Complaint Chief Complaint  Patient presents with   Knee Injury    RT    HPI Brindle Borton is a 62 y.o. female.   HPI Patient was getting down off a tall stool and her right knee twisted and she felt a immediate pain.  She states ever since then her knee has been very painful and she is having difficulty bearing weight.  She does not have any known knee problems.  He has tried Voltaren gel without improvement.  Past Medical History:  Diagnosis Date   Anemia    Arthritis    Diabetes mellitus without complication ([place])    History of colon polyps    Hyperlipidemia    Hypertension    Obesity     Patient Active Problem List   Diagnosis Date Noted   Cervicalgia 04/06/2018   Closed head injury with concussion 04/06/2018   Fall (on) (from) other stairs and steps, initial encounter 04/06/2018   Cigarette nicotine dependence without complication [id number]   Sebaceous cyst of labia 02/06/2018   Mass of axilla, left 02/04/2018   Postmenopausal 02/02/2018   History of colon polyps    Allergic rhinitis 09/04/2015   BMI 39.0-39.9,adult 09/04/2015   Primary hypertension 09/04/2015   Hyperlipidemia 09/04/2015   Type 2 diabetes mellitus without complication, without long-term current use of insulin ([place]) 04/26/2012   Sciatica 04/22/2012    Past Surgical History:  Procedure Laterality Date   COLONOSCOPY  07/07/2012   [facility]   NO PAST SURGERIES      OB History     Gravida  6   Para  4   Term      Preterm      AB  2   Living  4      SAB  1   IAB  1   Ectopic      Multiple      Live Births               Home Medications    Prior to Admission medications   Medication Sig Start Date End Date Taking? Authorizing Provider  naproxen (NAPROSYN) 375 MG tablet Take 1 tablet (375 mg total) by mouth 2 (two) times daily. 06/03/22  Yes  Raylene Everts, MD  traMADol (ULTRAM) 50 MG tablet Take 1 tablet (50 mg total) by mouth every 6 (six) hours as needed. 06/03/22  Yes Raylene Everts, MD  amLODipine (NORVASC) 10 MG tablet TAKE ONE TABLET BY MOUTH DAILY 02/23/22   Elmo Putt S, DO  atorvastatin (LIPITOR) 20 MG tablet TAKE ONE TABLET BY MOUTH DAILY 02/23/22   Owens Loffler, DO  Blood Glucose Monitoring Suppl (ONE TOUCH ULTRA 2) w/Device KIT Dx DM E11.8. Check blood sugar 3 times daily. 02/23/22   Owens Loffler, DO  Cholecalciferol (VITAMIN D3) 5000 units TABS Take 1,000 Units by mouth.    [provider]  glipiZIDE (GLUCOTROL XL) 10 MG 24 hr tablet TAKE ONE TABLET BY MOUTH DAILY WITH BREAKFAST 02/23/22   Elmo Putt S, DO  glucose blood (ONETOUCH ULTRA) test strip Dx DM E11.8. Check blood sugar 3 times daily. 02/23/22   Elmo Putt S, DO  glucose blood test strip Use one strip to test blood sugar. 09/03/16   [provider]  Lancets 30G MISC Dx DM E11.8. Check blood sugar 3 times  daily. 02/23/22   Owens Loffler, DO  lisinopril-hydrochlorothiazide (ZESTORETIC) 20-25 MG tablet TAKE ONE TABLET BY MOUTH DAILY 02/23/22   Owens Loffler, DO  metFORMIN (GLUCOPHAGE-XR) 500 MG 24 hr tablet TAKE TWO TABLETS BY MOUTH EVERY MORNING WITH BREAKFAST 02/23/22   Owens Loffler, DO    Family History Family History  Problem Relation Age of Onset   Hypertension Mother    COPD Mother    Cancer Father    Gastric cancer Father    Diabetes Daughter    Testicular cancer Son    Diabetes Son    Esophageal cancer Neg Hx    Colon cancer Neg Hx     Social History Social History   Tobacco Use   Smoking status: Every Day    Packs/day: 0.50    Years: 1.00    Total pack years: 0.50    Types: Cigarettes   Smokeless tobacco: Never  Vaping Use   Vaping Use: Never used  Substance Use Topics   Alcohol use: Yes    Comment: ocassionally   Drug use: Never     Allergies   Bupropion and Varenicline   Review of  Systems Review of Systems See HPI  Physical Exam Triage Vital Signs ED Triage Vitals  Enc Vitals Group     BP 06/03/22 1328 (!) 179/94     Pulse Rate 06/03/22 1328 65     Resp 06/03/22 1328 18     Temp 06/03/22 1328 98.1 F (36.7 C)     Temp Source 06/03/22 1328 Oral     SpO2 06/03/22 1328 100 %     Weight --      Height --      Head Circumference --      Peak Flow --      Pain Score 06/03/22 1326 8     Pain Loc --      Pain Edu? --      Excl. in [place]? --    No data found.  Updated Vital Signs BP (!) 179/94 (BP Location: Right Arm)   Pulse 65   Temp 98.1 F (36.7 C) (Oral)   Resp 18   LMP 11/09/2010   SpO2 100%      Physical Exam Constitutional:      General: She is not in acute distress.    Appearance: She is well-developed. She is obese.  HENT:     Head: Normocephalic and atraumatic.  Eyes:     Conjunctiva/sclera: Conjunctivae normal.     Pupils: Pupils are equal, round, and reactive to light.  Cardiovascular:     Rate and Rhythm: Normal rate.  Pulmonary:     Effort: Pulmonary effort is normal. No respiratory distress.  Abdominal:     General: There is no distension.     Palpations: Abdomen is soft.  Musculoskeletal:        General: Tenderness and signs of injury present. No swelling or deformity. Normal range of motion.     Cervical back: Normal range of motion.     Comments: Knee has limited flexion and extension.  Unable to determine instability due to body habitus, she is unable to get onto the table.  She does have medial joint line tenderness  Skin:    General: Skin is warm and dry.  Neurological:     Mental Status: She is alert.     Gait: Gait abnormal.      UC Treatments / Results  Labs (all labs ordered are  listed, but only abnormal results are displayed) Labs Reviewed - No data to display  EKG   Radiology DG Knee Complete 4 Views Right  Result Date: 06/03/2022 CLINICAL DATA:  Twisted knee, pain. EXAM: RIGHT KNEE - COMPLETE 4+ VIEW  COMPARISON:  None Available. FINDINGS: Four views. Mild tricompartmental osteoarthritis. No acute fracture or dislocation. Moderate joint effusion. Mild soft tissue swelling along the medial aspect of the knee joint. IMPRESSION: No acute fracture or dislocation. Moderate joint effusion with soft tissue swelling along the medial aspect of the knee joint. Electronically Signed   By: Emmit Alexanders M.D.   On: 06/03/2022 14:38    Procedures Procedures (including critical care time)  Medications Ordered in UC Medications  acetaminophen (TYLENOL) tablet 650 mg (650 mg Oral Given 06/03/22 1334)    Initial Impression / Assessment and Plan / UC Course  I have reviewed the triage vital signs and the nursing notes.  Pertinent labs & imaging results that were available during my care of the patient were reviewed by me and considered in my medical decision making (see chart for details).    Follow-up with Dr. Dianah Field in sports medicine Final Clinical Impressions(s) / UC Diagnoses   Final diagnoses:  Acute pain of right knee  Knee effusion, right  Primary localized osteoarthrosis of the knee, right     Discharge Instructions      Limit walking while knee painful Take the naproxen for knee swelling and inflammation Take the tramadol for knee pain Continue ice See Dr T in follow up     ED Prescriptions     Medication Sig Dispense Auth. Provider   traMADol (ULTRAM) 50 MG tablet Take 1 tablet (50 mg total) by mouth every 6 (six) hours as needed. 15 tablet Raylene Everts, MD   naproxen (NAPROSYN) 375 MG tablet Take 1 tablet (375 mg total) by mouth 2 (two) times daily. 20 tablet Raylene Everts, MD      I have reviewed the PDMP during this encounter.   Raylene Everts, MD 06/03/22 [phone]

## 2022-06-03 NOTE — ED Triage Notes (Signed)
Pt c/o RT knee pain since "tweaking" it getting off a tall stool on Monday. Swelling noted. Pain 8/10 Worse weight bearing. Diclofenac gel prn. No oral meds tried.

## 2022-06-09 ENCOUNTER — Ambulatory Visit: Payer: Medicaid Other | Admitting: Family Medicine

## 2022-06-09 ENCOUNTER — Encounter: Payer: Self-pay | Admitting: Family Medicine

## 2022-06-09 VITALS — BP 165/85 | HR 66 | Ht 68.0 in | Wt 256.1 lb

## 2022-06-09 DIAGNOSIS — F419 Anxiety disorder, unspecified: Secondary | ICD-10-CM

## 2022-06-09 DIAGNOSIS — Z712 Person consulting for explanation of examination or test findings: Secondary | ICD-10-CM | POA: Insufficient documentation

## 2022-06-09 DIAGNOSIS — I10 Essential (primary) hypertension: Secondary | ICD-10-CM | POA: Diagnosis not present

## 2022-06-09 DIAGNOSIS — E119 Type 2 diabetes mellitus without complications: Secondary | ICD-10-CM | POA: Diagnosis not present

## 2022-06-09 DIAGNOSIS — M25461 Effusion, right knee: Secondary | ICD-10-CM | POA: Insufficient documentation

## 2022-06-09 DIAGNOSIS — Z716 Tobacco abuse counseling: Secondary | ICD-10-CM | POA: Diagnosis not present

## 2022-06-09 DIAGNOSIS — M1711 Unilateral primary osteoarthritis, right knee: Secondary | ICD-10-CM | POA: Insufficient documentation

## 2022-06-09 LAB — POCT GLYCOSYLATED HEMOGLOBIN (HGB A1C): Hemoglobin A1C: 6.9 % — AB (ref 4.0–5.6)

## 2022-06-09 MED ORDER — NICOTINE 14 MG/24HR TD PT24: 14.0000 mg | MEDICATED_PATCH | Freq: Every day | TRANSDERMAL | 1 refills | Status: DC

## 2022-06-09 MED ORDER — ATORVASTATIN CALCIUM 20 MG PO TABS: 20.0000 mg | ORAL_TABLET | Freq: Every day | ORAL | 3 refills | Status: DC

## 2022-06-09 MED ORDER — GLIPIZIDE ER 10 MG PO TB24: 10.0000 mg | ORAL_TABLET | Freq: Every day | ORAL | 2 refills | Status: DC

## 2022-06-09 MED ORDER — METFORMIN HCL ER 500 MG PO TB24: ORAL_TABLET | ORAL | 3 refills | Status: DC

## 2022-06-09 MED ORDER — AMLODIPINE BESYLATE 10 MG PO TABS: 10.0000 mg | ORAL_TABLET | Freq: Every day | ORAL | 0 refills | Status: DC

## 2022-06-09 MED ORDER — DICLOFENAC SODIUM 1 % EX GEL: 2.0000 g | Freq: Four times a day (QID) | CUTANEOUS | 2 refills | Status: DC

## 2022-06-09 MED ORDER — LISINOPRIL-HYDROCHLOROTHIAZIDE 20-25 MG PO TABS: 1.0000 | ORAL_TABLET | Freq: Every day | ORAL | 3 refills | Status: DC

## 2022-06-09 NOTE — Assessment & Plan Note (Signed)
-   referral sent to therapy for patient to get in touch with someone to discuss incident that happened in her basement and discuss why she can't go in her basement

## 2022-06-09 NOTE — Assessment & Plan Note (Addendum)
-   discussed Ct resu
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
## 2022-06-09 NOTE — Assessment & Plan Note (Signed)
-   pt would like to quit smoking, have sent in 14mg  patches says one pack of cigarettes lasts her three days  - after 6 weeks of 14mg  patches we can switch to 7mg /day patch until craving is gone

## 2022-06-09 NOTE — Assessment & Plan Note (Signed)
-  pt just took BP meds and with knee pain I am not surprised BP is slightly elevated today. Pt says she monitors bp at home and it is usually 120 SBPs told her to keep an eye on her blood pressure as we don't want it >140s - continue amlodipine and lisinopril-hctz - will get CMP at next visit

## 2022-06-09 NOTE — Assessment & Plan Note (Addendum)
-   POC A1C today is 6.9  - will continue glipizide and metformin, refills sent to pharmacy - at next visit pt will need microalbumin/cr ratio and diabetic foot exam

## 2022-06-09 NOTE — Assessment & Plan Note (Signed)
-   R knee effusion, refilled voltaren gel for patient - have sent referral to Dr. Darene Lamer for joint aspiration. Xrays in chart

## 2022-06-10 ENCOUNTER — Ambulatory Visit (INDEPENDENT_AMBULATORY_CARE_PROVIDER_SITE_OTHER): Payer: Medicaid Other

## 2022-06-10 ENCOUNTER — Encounter: Payer: Medicaid Other | Admitting: Sports Medicine

## 2022-06-10 ENCOUNTER — Ambulatory Visit: Payer: Medicaid Other | Admitting: Sports Medicine

## 2022-06-10 ENCOUNTER — Other Ambulatory Visit: Payer: Self-pay | Admitting: Family Medicine

## 2022-06-10 DIAGNOSIS — M25461 Effusion, right knee: Secondary | ICD-10-CM | POA: Diagnosis not present

## 2022-06-10 DIAGNOSIS — F419 Anxiety disorder, unspecified: Secondary | ICD-10-CM

## 2022-06-10 NOTE — Progress Notes (Signed)
    Procedures performed today:    None.  Independent interpretation of notes and tests performed by another provider:   Procedure: Real-time Ultrasound Guided aspiration/injection of right knee Device: Samsung [id number]  Verbal informed consent obtained.  Time-out conducted.  Noted no overlying erythema, induration, or other signs of local infection.  Skin prepped in a sterile fashion.  Local anesthesia: Topical Ethyl chloride.  With sterile technique and under real time ultrasound guidance: I advanced an 18-gauge needle into the suprapatellar recess, aspirated approximately 20 mL of clear straw-colored fluid, syringe switched and 1 cc Kenalog 40, 2 cc lidocaine, 2 cc bupivacaine injected easily Completed without difficulty  Advised to call if fevers/chills, erythema, induration, drainage, or persistent bleeding.  Images permanently stored and available for review in PACS.  Impression: Technically successful ultrasound guided aspiration/injection.  Brief History, Exam, Impression, and Recommendations:    Effusion of right knee Pleasant 62 year old female, she describes rapid onset swelling right knee with warmth and pain, now starting to improve. On exam she does have an effusion, the knee does feel somewhat warm. She is improving but in the spirit of evaluating the joint fluid while I can still get it we will do an aspiration and injection today, I will send the fluid off for crystal analysis and cell counts. Return to see me in 6 weeks.    ____________________________________________ Gwen Her. Dianah Field, M.D., ABFM., CAQSM., AME. Primary Care and Sports Medicine [facility]  Adjunct Professor of [employer] [facility] [employer]  Risk manager

## 2022-06-10 NOTE — Assessment & Plan Note (Signed)
Pleasant 62 year old female, she describes rapid onset swelling right knee with warmth and pain, now starting to improve. On exam she does have an effusion, the knee does feel somewhat warm. She is improving but in the spirit of evaluating the joint fluid while I can still get it we will do an aspiration and injection today, I will send the fluid off for crystal analysis and cell counts. Return to see me in 6 weeks.

## 2022-06-11 LAB — CELL COUNT AND DIFF, FLUID, OTHER
Basophils, %: 0 %
Eosinophils, %: 0 %
Lymphocytes, %: 16 %
Mesothelial, %: 0 %
Monocyte/Macrophage %: 58 %
Neutrophils, %: 26 %
Total Nucleated Cell Ct: 691 cells/uL

## 2022-06-11 LAB — SYNOVIAL FLUID, CRYSTAL

## 2022-06-29 ENCOUNTER — Encounter: Payer: Self-pay | Admitting: Sports Medicine

## 2022-06-29 NOTE — Telephone Encounter (Signed)
Cannot do it but Dr. Raeford Razor [facility]) and Dr. Burnard Bunting ([facility]) might have openings.

## 2022-07-08 ENCOUNTER — Ambulatory Visit: Payer: Self-pay | Admitting: Family Medicine

## 2022-08-03 ENCOUNTER — Ambulatory Visit: Payer: Medicaid Other | Admitting: Sports Medicine

## 2022-08-03 ENCOUNTER — Ambulatory Visit (INDEPENDENT_AMBULATORY_CARE_PROVIDER_SITE_OTHER): Payer: Medicaid Other | Admitting: Licensed Clinical Social Worker

## 2022-08-03 DIAGNOSIS — F411 Generalized anxiety disorder: Secondary | ICD-10-CM

## 2022-08-03 DIAGNOSIS — M1711 Unilateral primary osteoarthritis, right knee: Secondary | ICD-10-CM

## 2022-08-03 NOTE — Assessment & Plan Note (Signed)
Very pleasant 62 year old female rapid onset swelling right knee warmth and pain, we did an aspiration and injection, crystal analysis was negative. She returns today, she is completely pain-free, never had any pain after the procedure, she does have some swelling, I explained this was normal with arthritis, she will do some home conditioning, cryotherapy, and return to see me as needed.

## 2022-08-03 NOTE — Progress Notes (Signed)
    Procedures performed today:    None.  Independent interpretation of notes and tests performed by another provider:   None.  Brief History, Exam, Impression, and Recommendations:    Primary osteoarthritis of right knee Very pleasant 62 year old female rapid onset swelling right knee warmth and pain, we did an aspiration and injection, crystal analysis was negative. She returns today, she is completely pain-free, never had any pain after the procedure, she does have some swelling, I explained this was normal with arthritis, she will do some home conditioning, cryotherapy, and return to see me as needed.    ____________________________________________ [name]. Benjamin Stain, M.D., ABFM., CAQSM., AME. Primary Care and Sports Medicine [facility]  Adjunct Professor of Family Medicine  [employer] [facility] [employer]  Restaurant manager, fast food

## 2022-08-03 NOTE — Progress Notes (Addendum)
Comprehensive Clinical Assessment (CCA) Note  08/03/2022 Morgan Garza [id number]  Chief Complaint:  Chief Complaint  Patient presents with   Anxiety   Visit Diagnosis: Generalized Anxiety Disorder    CCA Biopsychosocial Intake/Chief Complaint:  a whole lot happened in past 4 years that built up noting changes in her behavior, anxiety, memory lapses. These things are bigger than her not just a decision it was like nothing could do to prevent these things. Three of the biggest things mom chronically ill and she wanted a process to happen on her last days and on her last day siblings cause it to go so wrong so had to let them go after she passed. No contact it was that bad. 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 [employer]-there and thought owner was going to kill her. They were trying to do something they made her extremely ill and couldn't finish the contract. Went for treatment they are still dealing with it. Only check for three things and want them to check for chemicals. They can't if cancer unless take a piece of tissue still dealing with that bronoscopy. IRS is ripping her apart just emptied all her accounts now beating her head trying to pull together money to keep bills being paid. Found a dead Body in her garage he was a great family friend. Son's bestfriend was there when he went into garage. Went to do some things with new contract spiritually forced to go back to garage found son first he was near death. They were smoking weed and tainted with fentanyl. EMS was there they took care of son. Mom called her after EMS looking for son. Patient found him after they left CPR and 911 calling but now a dead body. this happened a year ago. Haven't been able to go back to home and found she was running. This happened before got sick at [employer].  Current Symptoms/Problems: anxiety, a couple of times couldn't land on her feet and always been able to land on her feet. here in  therapy wants to go home, three kids in house now won't sell the house.  Does go to the house between contracts goes to [place] that is a safe place for her   Patient Reported Schizophrenia/Schizoaffective Diagnosis in Past: No   Strengths: Create ways to decompress, to manage through life's life and always done pretty good at it.  Preferences: see above  Abilities: love nature loves being outside has to have a visual of the trees, flowers, the birds why go to the garage open the door there was a lake across the street   Type of Services Patient Feels are Needed: therapy   Initial Clinical Notes/Concerns: Treatment history-n/a Anxiety-cont-disconnect is not aware ask they saw things on the right side hide and then back in [facility] they are seeing things on the other side of her body related to her lungs. they are not sure bronchoscopy.  The problem if they do it on 1 side and it is not the right side will have to go to the other side the problem is they are not listening to her. Has gone through 3rd opinion. Medical-has to have lung scope, worried about whether there is cancer. Family history-her son the one found he has been diagnosed with everything schizophrenia, bipolar a load of heath issues from 48-47 years old. Raising him was like every day to provide a place of stability for the family every day had to be creative with  that. Sometimes he was on too many meds, sometimes wanted her to try different things didn't agree with. Managed his care completely. Coped and managed by letting the other kids help him in managing care. didn't want them to feel like he was getting all the attention. Taught to point of abel to de-cycle when he was elevated. They became an important part of his life and understand him well if something happened to her he would be ok with them.   Mental Health Symptoms Depression:   None   Duration of Depressive symptoms: No data recorded  Mania:   None    Anxiety:    Worrying; Difficulty concentrating; Fatigue; Irritability; Restlessness; Tension (possibility of cancer causes the worry. disconnect with doctors and everything going on with that. Doctor Kathryne Sharper pulmonologist to discuss with doctor cancer center at [facility] to discuss all of this.)   Psychosis:   None   Duration of Psychotic symptoms: n/a  Trauma:   None; Re-experience of traumatic event (body in garage)   Obsessions:   None   Compulsions:   None   Inattention:   None   Hyperactivity/Impulsivity:   None   Oppositional/Defiant Behaviors:   None   Emotional Irregularity:   None   Other Mood/Personality Symptoms:   anxiety-describes not being not like herself was going to fly met daughter who works became panky and fearful and daughter had to calm down even though flies all the time. Coping-before garage incident did everything in garage meditate talk to God, plan, decompress therapist noted safe place and she said don't have that anymore. Stressors-not finding language can't recall her own vocabulary. Um and pausing a lot did testing neurology to see if anything going on with brain and tested was fine. Skills-cont-trying to stabilize stressors working on all of this but have not stabilized yet has not been successful    Mental Status Exam Appearance and self-care  Stature:   Tall   Weight:   Overweight   Clothing:   Casual   Grooming:   Normal   Cosmetic use:   None   Posture/gait:   Normal   Motor activity:   Not Remarkable   Sensorium  Attention:   Normal   Concentration:   Normal   Orientation:   X5   Recall/memory:   Normal (patient is aware when she speaks and recall her own vocabulary simple words struggling to find and recall)   Affect and Mood  Affect:   Appropriate; Anxious   Mood:   Anxious   Relating  Eye contact:   Normal   Facial expression:   Responsive   Attitude toward examiner:   Cooperative    Thought and Language  Speech flow:  Normal   Thought content:   Appropriate to Mood and Circumstances   Preoccupation:   None   Hallucinations:   None   Organization:  No data recorded  [employer] of Knowledge:   Average   Intelligence:   Above Average   Abstraction:   Normal   Judgement:   Fair   Reality Testing:   Realistic   Insight:   Fair   Decision Making:   -- (doesn't trust her decision making because she knows not herself. Feels definitely since [place] incident August 1 when got ill)   Social Functioning  Social Maturity:   Responsible (connect with people but people irrate her easily unlike before.)   Social Judgement:   Normal; Naive   Stress  Stressors:   Illness; Housing; Surveyor, quantity; Work (feel displaced in a rental house. With work feels like have to exhausted her resources if don't get to work right away going to get in trouble IRS took everything out. What makes her nervous about going back to work memory lapses unable to recall the-)   Coping Ability:   Overwhelmed; Exhausted   Skill Deficits:   -- (feels homeless wants to go home, needs a place to call home sleeping on couches airbed's got rid of furniture wants a home whether hers or find another one, dealing with not feeling herself stress around medical issues-)   Supports:   Family (kids-talk to them living-son, niece and patient rented home just happened. Son came to [place] to come get her. Drove with back and took straight to his apartment until got rental house hasn't slept at her house for a long time.)     Religion: Religion/Spirituality Are You A Religious Person?: Yes (feels a lot that goes on is divine.) How Might This Affect Treatment?: n/a  Leisure/Recreation: Leisure / Recreation Do You Have Hobbies?: Yes Leisure and Hobbies: see above  Exercise/Diet: Exercise/Diet Do You Exercise?: No (did this until this happened with the lungs.) Have You  Gained or Lost A Significant Amount of Weight in the Past Six Months?: No Do You Follow a Special Diet?: No Do You Have Any Trouble Sleeping?: No   CCA Employment/Education Employment/Work Situation: Employment / Work Situation Employment Situation: Unemployed What is the Longest Time Patient has Held a Job?: holds on to her jobs 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 Where was the Patient Employed at that Time?: Traveling nurse most recent work worked [employer], workaholic Has Patient ever Been in Equities trader?: No  Education: Education Is Patient Currently Attending School?: No. Graduated from high school-split between two states across the states traumatized her but [employer] so [place] and [place]. Graduated from college-Last college [employer]-graduated with psychology degree. Also had nursing emergency medical science and carpentry. All degrees except the carpentry. Supposed to go to graduate school for psychology ended up moving to [place] to help brother. Closest place was 3 hours away couldn't make it happen very upsetting about that.  Didn't struggle and have difficulty in school Got rebellious in high school tired of moving grades horrible because didn't apply herself. When went to college a different things.   CCA Family/Childhood History Family and Relationship History: Family history Marital status: Divorced Divorced, when?: 2015. Married three times What types of issues is patient dealing with in the relationship?: n/a Are you sexually active?: Yes What is your sexual orientation?: heterosexual Has your sexual activity been affected by drugs, alcohol, medication, or emotional stress?: no Does patient have children?: Yes How many children?: 4 How is patient's relationship with their children?: 39-Anthony Darrick Grinder Garner-35, Lattie Haw Maryellen Pile Garner-28-great relationship  Childhood History:   Childhood History By whom was/is the patient raised?: Both parents Additional childhood history information: Dad was in [employer] and he was away a lot. Patient's responsibilities was not able to just be a child had to take care of the other kids patient the oldest. Description of patient's relationship with caregiver when they were a child: good relationship Patient's description of current relationship with people who raised him/her: both passed great relationship with them until they died. Took care of them when going through their death journey. How were you disciplined when  you got in trouble as a child/adolescent?: so sensitive that all had to be was disappointed in her. Does patient have siblings?: Yes Number of Siblings: 3 Description of patient's current relationship with siblings: 1 sister and 2 brothers-patient is the oldest disconnected herself from all three. Did patient suffer any verbal/emotional/physical/sexual abuse as a child?: No Did patient suffer from severe childhood neglect?: No Has patient ever been sexually abused/assaulted/raped as an adolescent or adult?: No Was the patient ever a victim of a crime or a disaster?: No Witnessed domestic violence?: No Has patient been affected by domestic violence as an adult?: No  Child/Adolescent Assessment: n/a     CCA Substance Use Alcohol/Drug Use: no issues with d/a describes herself as workaholic. Once had kids loved kids and being around them.                            ASAM's:  Six Dimensions of Multidimensional Assessment  Dimension 1:  Acute Intoxication and/or Withdrawal Potential:      Dimension 2:  Biomedical Conditions and Complications:      Dimension 3:  Emotional, Behavioral, or Cognitive Conditions and Complications:     Dimension 4:  Readiness to Change:     Dimension 5:  Relapse, Continued use, or Continued Problem Potential:     Dimension 6:  Recovery/Living Environment:     ASAM Severity  Score:    ASAM Recommended Level of Treatment:     Substance use Disorder (SUD)-n/a    Recommendations for Services/Supports/Treatments: Recommendations for Services/Supports/Treatments Recommendations For Services/Supports/Treatments: Individual Therapy  DSM5 Diagnoses: Patient Active Problem List   Diagnosis Date Noted   Primary osteoarthritis of right knee 06/09/2022   Encounter for smoking cessation counseling 06/09/2022   Anxiousness 06/09/2022   Physical exam 04/27/2022   Cervicalgia 04/06/2018   Closed head injury with concussion 04/06/2018   Fall (on) (from) other stairs and steps, initial encounter 04/06/2018   Cigarette nicotine dependence without complication 02/06/2018   Sebaceous cyst of labia 02/06/2018   Mass of axilla, left 02/04/2018   Postmenopausal 02/02/2018   History of colon polyps    Allergic rhinitis 09/04/2015   BMI 39.0-39.9,adult 09/04/2015   Primary hypertension 09/04/2015   Hyperlipidemia 09/04/2015   Type 2 diabetes mellitus without complication, without long-term current use of insulin (HCC) 04/26/2012   Sciatica 04/22/2012    Patient Centered Plan: Patient is on the following Treatment Plan(s):  Anxiety, coping for stressors-patient relates goal includes putting pegs in place have not felt that in a while last 4 years cannot land on feet has most of her life and able to do that create a safe place for herself and family last 4 years be on overwhelmed-completed assessment including education piece, questionnaires treatment plan   Referrals to Alternative Service(s): Referred to Alternative Service(s):   Place:   Date:   Time:    Referred to Alternative Service(s):   Place:   Date:   Time:    Referred to Alternative Service(s):   Place:   Date:   Time:    Referred to Alternative Service(s):   Place:   Date:   Time:      Collaboration of Care: none needed  Patient/Guardian was advised Release of Information must be obtained prior to any  record release in order to collaborate their care with an outside provider. Patient/Guardian was advised if they have not already done so to contact the registration department to sign all  necessary forms in order for [place] to release information regarding their care.   Consent: Patient/Guardian gives verbal consent for treatment and assignment of benefits for services provided during this visit. Patient/Guardian expressed understanding and agreed to proceed.   Coolidge Breeze, LCSW

## 2022-08-19 ENCOUNTER — Ambulatory Visit (INDEPENDENT_AMBULATORY_CARE_PROVIDER_SITE_OTHER): Payer: Medicaid Other | Admitting: Licensed Clinical Social Worker

## 2022-08-19 ENCOUNTER — Encounter (HOSPITAL_COMMUNITY): Payer: Self-pay

## 2022-08-19 DIAGNOSIS — F411 Generalized anxiety disorder: Secondary | ICD-10-CM | POA: Diagnosis not present

## 2022-08-19 NOTE — Progress Notes (Signed)
Virtual Visit via Video Note  I connected with Morgan Garza on 08/19/22 at 11:00 AM EDT by a video enabled telemedicine application and verified that I am speaking with the correct person using two identifiers.  Location: Patient: home Provider: home office   I discussed the limitations of evaluation and management by telemedicine and the availability of in person appointments. The patient expressed understanding and agreed to proceed.  I discussed the assessment and treatment plan with the patient. The patient was provided an opportunity to ask questions and all were answered. The patient agreed with the plan and demonstrated an understanding of the instructions.   The patient was advised to call back or seek an in-person evaluation if the symptoms worsen or if the condition fails to improve as anticipated.  I provided 48 minutes of non-face-to-face time during this encounter.  THERAPIST PROGRESS NOTE  Session Time: 11:00 AM to 11:48 AM  Participation Level: Active  Behavioral Response: CasualAlertAnxious mood euthymic in session  Type of Therapy: Individual Therapy  Treatment Goals addressed: Working on anxiety noting some of the root causes are financial issues health issues, needing to work on reconnecting to her home, coping  ProgressTowards Goals: Initial-we were able to finish initial paperwork also therapist began to educate patient on trauma related cognitions as they relate to finding a dead body in her house  Interventions: CBT, Solution Focused, Strength-based, Supportive, Reframing, and Other: trauma, coping  Summary: Morgan Garza is a 62 y.o. female who presents with come to realize that things are bigger than herself not focus on it and fix things bigger than her. IRS coming after her visited the place and talked to them they are going to do what they are going to do. Mom and events surrounding her death ask God to give her forgiveness has to come from the  heart even if stand in front of them still think she will have aminosity even though thought forgive and move on and no contact. Working on that with God not sure if coming from heart but rather the head. Hoping that get out of therapy looking at her home a different way. The garage was her safe place. Still a lot of anxiety. Thought about her strengths-good with self-awareness. Facing truth and not just rationalizing herself to feel better. Come to realize therapist said safe place never thought of it as safe place and taken away when place take away. Through therapist and God realize may need to  create a safe place. Always been a wortkaholic and raised four children practical being a parent. All stressors created a safe place somewhere. Sometimes a park and stay there as long as need to the beauty of the world help her to do this. Wherever she is create a safe place therapist called it that that is the right name for it patient thinks. So much beauty in house when found dead body disconnect not connected to home emotionally and spiritually haven't able to recapture that. Wasn't realizing that she was running away until incident in [place] that forced her home through hearing of God's voice don't go anywhere. Through God learning have to face it. Doesn't know why she was one had to find them. Son in there people are always walking by looking in garage daughter did that and didn't see them. Why she missed that. After she left patient went into garage don't understand why she had to do that messed her up. That is her conversation with God sometimes can't ask  why and sometimes reveals why. Haven't found what is in lungs plan to have bronchoscopy will find out what is going on. Allowed [facility] to do procedure. Feels man doing something in vents to make her sick in [place]. Feels that something there refuse to believe cancer because of other symptoms altered mental status that haven't resolved  that cancer doesn't answer. They had to do CAT scan to do bronchoscopy spots haven't changed a size in a year. Year not worked. Want them to go in and treat what they need to treat. Pulmonologist persepctive uncomfortable need to know and to treat. Patent wants that to go in and  figure it out and feel more comfortable with them.  Session also used to explore more in depth some of the issues and approaches to take with patient's issues  Patient shared insights that therapist assesses she would develop in therapy so very helpful letting go of things that are bigger than herself related to IRS.  Working with God on forgiveness this is bigger than off but working on this through her spirituality.  Patient identifying needing to find a new safe place also working through the disconnect she has had with her house therapist noted realization things change so that is healthy to recognize that she may need to change her safe place.  At the same time therapist talked about trauma related cognitions that therapist believes happened when she found a dead body working in therapy to challenge and change the distortions are stuck points that we will help her heal the disconnect although may need to work on finding a new safe place.  Worked on treatment plan patient gave consent to complete virtually.  She noted one of her strengths self-awareness therapist noted this is a strength for therapy as therapy aims to get her subjective perspective out and look at things the way they are clear from what patient has shared that she has some of this.  Therapist provided positive feedback for moving forward with health issues thinks this is good coping as it is a significant stressor for her.  Therapist provided space and support for patient to talk about thoughts and feelings in session.  Also in session completed assessment questionnaires. Suicidal/Homicidal: No  Plan: Return again in 2 weeks.2.  Look at CPT for trauma in particular  explanation for cognitive approach, can also look at CBT for anxiety  Diagnosis: Generalized anxiety disorder  Collaboration of Care: Other none needed  Patient/Guardian was advised Release of Information must be obtained prior to any record release in order to collaborate their care with an outside provider. Patient/Guardian was advised if they have not already done so to contact the registration department to sign all necessary forms in order for [place] to release information regarding their care.   Consent: Patient/Guardian gives verbal consent for treatment and assignment of benefits for services provided during this visit. Patient/Guardian expressed understanding and agreed to proceed.   Coolidge Breeze, [place] 08/19/2022

## 2022-08-27 ENCOUNTER — Encounter (HOSPITAL_COMMUNITY): Payer: Self-pay

## 2022-08-27 ENCOUNTER — Ambulatory Visit (INDEPENDENT_AMBULATORY_CARE_PROVIDER_SITE_OTHER): Payer: Medicaid Other | Admitting: Licensed Clinical Social Worker

## 2022-08-27 DIAGNOSIS — F411 Generalized anxiety disorder: Secondary | ICD-10-CM

## 2022-08-27 NOTE — Progress Notes (Signed)
Therapist contacted patient through My Chart and she did not respond 

## 2022-09-01 ENCOUNTER — Ambulatory Visit (INDEPENDENT_AMBULATORY_CARE_PROVIDER_SITE_OTHER): Payer: Medicaid Other | Admitting: Licensed Clinical Social Worker

## 2022-09-01 DIAGNOSIS — F411 Generalized anxiety disorder: Secondary | ICD-10-CM | POA: Diagnosis not present
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
## 2022-09-07 ENCOUNTER — Ambulatory Visit (INDEPENDENT_AMBULATORY_CARE_PROVIDER_SITE_OTHER): Payer: Medicaid Other | Admitting: Licensed Clinical Social Worker

## 2022-09-07 DIAGNOSIS — F411 Generalized anxiety disorder: Secondary | ICD-10-CM | POA: Diagnosis not present
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
## 2022-09-09 ENCOUNTER — Ambulatory Visit: Payer: Medicaid Other | Admitting: Family Medicine

## 2022-09-09 ENCOUNTER — Encounter: Payer: Self-pay | Admitting: Family Medicine

## 2022-09-09 VITALS — BP 136/62 | HR 65 | Ht 68.0 in | Wt 253.8 lb

## 2022-09-09 DIAGNOSIS — Z7984 Long term (current) use of oral hypoglycemic drugs: Secondary | ICD-10-CM

## 2022-09-09 DIAGNOSIS — R413 Other amnesia: Secondary | ICD-10-CM | POA: Diagnosis not present

## 2022-09-09 DIAGNOSIS — I10 Essential (primary) hypertension: Secondary | ICD-10-CM | POA: Diagnosis not present

## 2022-09-09 DIAGNOSIS — E119 Type 2 diabetes mellitus without complications: Secondary | ICD-10-CM

## 2022-09-09 DIAGNOSIS — F419 Anxiety disorder, unspecified: Secondary | ICD-10-CM | POA: Diagnosis not present

## 2022-09-09 LAB — POCT GLYCOSYLATED HEMOGLOBIN (HGB A1C): HbA1c POC (<> result, manual entry): 7.1 % (ref 4.0–5.6)

## 2022-09-09 MED ORDER — OZEMPIC (0.25 OR 0.5 MG/DOSE) 2 MG/3ML ~~LOC~~ SOPN: 0.2500 mg | PEN_INJECTOR | SUBCUTANEOUS | 1 refills | Status: DC

## 2022-09-09 NOTE — Progress Notes (Signed)
Established patient visit   Patient: Morgan Garza   DOB: 04-25-1960   62 y.o. 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 She has apt with oncology at the end of the week for further discussion.   Review of Systems  Constitutional:  Negative for activity change, fatigue and fever.  Respiratory:  Negative for cough and shortness of breath.   Cardiovascular:  Negative for chest pain.  Gastrointestinal:  Negative for abdominal pain.  Genitourinary:  Negative for difficulty urinating.       Current Meds  Medication Sig   amLODipine (NORVASC) 10 MG tablet Take 1 tablet (10 mg total) by mouth daily.   atorvastatin (LIPITOR) 20 MG tablet Take 1 tablet (20 mg total) by mouth daily.   Blood Glucose Monitoring Suppl (ONE TOUCH ULTRA 2) w/Device KIT Dx DM E11.8. Check blood sugar 3 times daily.   Cholecalciferol (VITAMIN D3) 5000 units TABS Take 1,000 Units by mouth.   diclofenac Sodium (VOLTAREN) 1 % GEL Apply 2 g topically 4 (four) times daily. Apply 2g topically to knee up to four times daily   glipiZIDE (GLUCOTROL XL) 10 MG 24 hr tablet Take 1 tablet (10 mg total) by mouth daily with breakfast.   glucose blood (ONETOUCH ULTRA) test strip Dx DM E11.8. Check blood sugar 3 times daily.   glucose blood test strip Use one strip to test blood sugar.   Lancets 30G MISC Dx DM E11.8. Check blood sugar 3 times daily.    lisinopril-hydrochlorothiazide (ZESTORETIC) 20-25 MG tablet Take 1 tablet by mouth daily.   metFORMIN (GLUCOPHAGE-XR) 500 MG 24 hr tablet TAKE TWO TABLETS BY MOUTH EVERY MORNING WITH BREAKFAST   nicotine (NICODERM CQ - DOSED IN MG/24 HOURS) 14 mg/24hr patch Place 1 patch (14 mg total) onto the skin daily.   Semaglutide,0.25 or 0.5MG /DOS, (OZEMPIC, 0.25 OR 0.5 MG/DOSE,) 2 MG/3ML SOPN Inject 0.25 mg into the skin once a week. For four weeks then move to 0.5mg  for four weeks    OBJECTIVE    BP 136/62   Pulse 65   Ht 5\' 8"  (1.727 m)   Wt 253 lb 12 oz (115.1 kg)   LMP 11/09/2010   SpO2 100%   BMI 38.58 kg/m   Physical Exam Vitals and nursing note reviewed.  Constitutional:      General: She is not in acute distress.    Appearance: Normal appearance.  HENT:     Head: Normocephalic and atraumatic.     Right Ear: External ear normal.     Left Ear: External ear normal.     Nose: Nose normal.  Eyes:     Conjunctiva/sclera: Conjunctivae normal.  Cardiovascular:     Rate and Rhythm: Normal rate and regular rhythm.  Pulmonary:     Effort: Pulmonary effort is normal.  Breath sounds: Normal breath sounds.  Neurological:     General: No focal deficit present.     Mental Status: She is alert and oriented to person, place, and time.  Psychiatric:        Mood and Affect: Mood normal.        Behavior: Behavior normal.        Thought Content: Thought content normal.        Judgment: Judgment normal.          ASSESSMENT & PLAN    Problem List Items Addressed This Visit       Cardiovascular and Mediastinum   Primary hypertension    - continue current medical therapy - getting blood work today        Relevant Orders   BASIC METABOLIC PANEL WITH GFR     Endocrine   Type 2 diabetes mellitus without complication, without long-term current use of insulin (HCC) - Primary    - discussed ozempic and I would like pt to be on this rather than glipizide and metformin. She says she  gets issues with metformin of GI upset.  - discussed taking ozempic once weekly      Relevant Medications   Semaglutide,0.25 or 0.5MG /DOS, (OZEMPIC, 0.25 OR 0.5 MG/DOSE,) 2 MG/3ML SOPN   Other Relevant Orders   POCT HgB A1C   Microalbumin / creatinine urine ratio   BASIC METABOLIC PANEL WITH GFR     Other   Memory deficit    - pt says she has been having a lot of difficulty with her memory. She recalls instances where she ask questions she should know the answer to because it was said two minutes prior or she gets confused about things. She says even her kids have noticed she is not as sharp - will order blood work to look for metabolic cause - if negative work up may need referral to neurology      Relevant Orders   CBC   TSH + free T4   Vitamin B12   Urinalysis, Routine w reflex microscopic   RPR   HIV antibody (with reflex)    Return in about 3 months (around 12/10/2022).      Meds ordered this encounter  Medications   Semaglutide,0.25 or 0.5MG /DOS, (OZEMPIC, 0.25 OR 0.5 MG/DOSE,) 2 MG/3ML SOPN    Sig: Inject 0.25 mg into the skin once a week. For four weeks then move to 0.5mg  for four weeks    Dispense:  3 mL    Refill:  1    Orders Placed This Encounter  Procedures   Microalbumin / creatinine urine ratio   CBC   TSH + free T4   Vitamin B12   BASIC METABOLIC PANEL WITH GFR   Urinalysis, Routine w reflex microscopic   RPR   HIV antibody (with reflex)   POCT HgB A1C     Charlton Amor, DO  [facility] at [facility] [phone] (phone) [phone] (fax)  [facility]

## 2022-09-09 NOTE — Assessment & Plan Note (Signed)
-   pt says she has been having a lot of difficulty with her memory. She recalls instances where she ask questions she should know the answer to because it was said two minutes prior or she gets confused about things. She says even her kids have noticed she is not as sharp - will order blood work to look for metabolic cause - if negative work up may need referral to neurology

## 2022-09-09 NOTE — Assessment & Plan Note (Signed)
-   doing well with therapy. Was told at last therapy session they would take a break because she is handling everything appropriately

## 2022-09-09 NOTE — Assessment & Plan Note (Signed)
-   continue current medical therapy - getting blood work today

## 2022-09-09 NOTE — Assessment & Plan Note (Signed)
-   discussed ozempic and I would like pt to be on this rather than glipizide and metformin. She says she gets issues with metformin of GI upset.  - discussed taking ozempic once weekly

## 2022-09-10 ENCOUNTER — Encounter: Payer: Self-pay | Admitting: Family Medicine

## 2022-09-10 LAB — BASIC METABOLIC PANEL WITH GFR
BUN: 16 mg/dL (ref 7–25)
CO2: 24 mmol/L (ref 20–32)
Calcium: 9.7 mg/dL (ref 8.6–10.4)
Chloride: 99 mmol/L (ref 98–110)
Creat: 0.87 mg/dL (ref 0.50–1.05)
Glucose, Bld: 108 mg/dL — ABNORMAL HIGH (ref 65–99)
Potassium: 4.6 mmol/L (ref 3.5–5.3)
Sodium: 136 mmol/L (ref 135–146)
eGFR: 75 mL/min/{1.73_m2} (ref >=60)

## 2022-09-10 LAB — URINALYSIS, ROUTINE W REFLEX MICROSCOPIC
Bilirubin Urine: NEGATIVE
Glucose, UA: NEGATIVE
Hgb urine dipstick: NEGATIVE
Ketones, ur: NEGATIVE
Leukocytes,Ua: NEGATIVE
Nitrite: NEGATIVE
Protein, ur: NEGATIVE
Specific Gravity, Urine: 1.019 (ref 1.001–1.035)
pH: 5.5 (ref 5.0–8.0)

## 2022-09-10 LAB — CBC
HCT: 43 % (ref 35.0–45.0)
Hemoglobin: 13.6 g/dL (ref 11.7–15.5)
MCH: 26.1 pg — ABNORMAL LOW (ref 27.0–33.0)
MCHC: 31.6 g/dL — ABNORMAL LOW (ref 32.0–36.0)
MCV: 82.5 fL (ref 80.0–100.0)
MPV: 11 fL (ref 7.5–12.5)
Platelets: 228 10*3/uL (ref 140–400)
RBC: 5.21 10*6/uL — ABNORMAL HIGH (ref 3.80–5.10)
RDW: 15.4 % — ABNORMAL HIGH (ref 11.0–15.0)
WBC: 5.6 10*3/uL (ref 3.8–10.8)

## 2022-09-10 LAB — MICROALBUMIN / CREATININE URINE RATIO
Creatinine, Urine: 157 mg/dL (ref 20–275)
Microalb Creat Ratio: 6 mg/g creat (ref <30)
Microalb, Ur: 0.9 mg/dL

## 2022-09-10 LAB — HIV ANTIBODY (ROUTINE TESTING W REFLEX): HIV 1&2 Ab, 4th Generation: NONREACTIVE

## 2022-09-10 LAB — TSH+FREE T4: TSH W/REFLEX TO FT4: 3.09 mIU/L (ref 0.40–4.50)

## 2022-09-10 LAB — RPR: RPR Ser Ql: NONREACTIVE

## 2022-09-10 LAB — VITAMIN B12: Vitamin B-12: 587 pg/mL (ref 200–1100)

## 2022-09-22 ENCOUNTER — Telehealth: Payer: Self-pay

## 2022-09-22 NOTE — Telephone Encounter (Signed)
Initiated Prior authorization ZOX:[id number] (0.25 or 0.5 MG/DOSE) 2MG /3ML pen-injectors Via: Covermymeds Case/Key:[id number] Status: denied as of 09/22/22 Reason:[employer] reviewed your OZEMPIC 0.25-0.5 MG/DOSE PEN request for the above-identified  member, and it is denied for the following reason: because we did not see what we need to  approve the drug you asked for, (Ozempic). We may be able to approve this drug when we  see certain records (records that you got better while on the requested drug). If we receive  these records, we may need more information (if certain goals have been met; if you are  getting closer to meeting certain goals). We based this decision on your health plan's prior  authorization clinical criteria named GLP-1 Receptor Agonists and Combinations. Notified Pt via: Mychart

## 2022-09-23 ENCOUNTER — Other Ambulatory Visit: Payer: Self-pay | Admitting: Family Medicine

## 2022-09-23 ENCOUNTER — Encounter: Payer: Self-pay | Admitting: Family Medicine

## 2022-09-23 MED ORDER — RYBELSUS 3 MG PO TABS: 3.0000 mg | ORAL_TABLET | Freq: Every day | ORAL | 2 refills | Status: DC

## 2022-09-24 ENCOUNTER — Ambulatory Visit: Payer: Medicaid Other | Admitting: Sports Medicine

## 2022-09-24 ENCOUNTER — Telehealth: Payer: Self-pay

## 2022-09-24 DIAGNOSIS — C3490 Malignant neoplasm of unspecified part of unspecified bronchus or lung: Secondary | ICD-10-CM

## 2022-09-24 DIAGNOSIS — M1711 Unilateral primary osteoarthritis, right knee: Secondary | ICD-10-CM | POA: Diagnosis not present

## 2022-09-24 DIAGNOSIS — M5416 Radiculopathy, lumbar region: Secondary | ICD-10-CM

## 2022-09-24 MED ORDER — PREDNISONE 50 MG PO TABS: ORAL_TABLET | ORAL | 0 refills | Status: DC

## 2022-09-24 MED ORDER — GABAPENTIN 300 MG PO CAPS: ORAL_CAPSULE | ORAL | 3 refills | Status: DC

## 2022-09-24 NOTE — Assessment & Plan Note (Addendum)
Pleasant 62 year old female with history of lung adenocarcinoma stage III with increasing pain running down from the right lateral lower leg to the middle 3 toes, sparing the big toe and the small toe. Worse when sitting in the car. She does have some discomfort in her knee but I think this is more of a radicular process. Adding 5 days of prednisone, Neurontin at night, formal PT. [facility] did show multilevel lumbar spondylosis but did not go into the detail as to what levels were involved. Due to history of pulmonary adenocarcinoma we will proceed with early MRI. If persistent discomfort at the 6-week follow-up we will proceed with epidural.  Likely right L5-S1 interlaminar.  Update: Please see MRI notes, proceeding with L5-S1 transforaminal epidural.  Patient can see me back in 4 to 6 weeks after the injection.

## 2022-09-24 NOTE — Telephone Encounter (Signed)
Initiated Prior authorization UXL:[id number] 3 MG TABLET Via: Covermymeds Case/Key:[id number] Status: approved  as of 09/24/22 Reason:RYBELSUS 3 MG TABLET: quantity/billable units of 30 approved for 09/24/2022-09/24/2023.  Notified Pt via: Mychart

## 2022-09-24 NOTE — Progress Notes (Addendum)
    Procedures performed today:    None.  Independent interpretation of notes and tests performed by another provider:   None.  Brief History, Exam, Impression, and Recommendations:    Right lumbar radiculopathy Pleasant 62 year old female with history of lung adenocarcinoma stage III with increasing pain running down from the right lateral lower leg to the middle 3 toes, sparing the big toe and the small toe. Worse when sitting in the car. She does have some discomfort in her knee but I think this is more of a radicular process. Adding 5 days of prednisone, Neurontin at night, formal PT. She [facility] did show multilevel lumbar spondylosis but did not go into the detail as to what levels were involved. Due to history of pulmonary adenocarcinoma we will proceed with early MRI. If persistent discomfort at the 6-week follow-up we will proceed with epidural.  Likely right L5-S1 interlaminar.  Update: Please see MRI notes, proceeding with L5-S1 transforaminal epidural.  Patient can see me back in 4 to 6 weeks after the injection.  Primary osteoarthritis of right knee We did an aspiration and injection with a negative crystal analysis back in March, she does have some swelling and some discomfort in the knee but her principal pain is I think radicular.    ____________________________________________ [name]. Benjamin Stain, M.D., ABFM., CAQSM., AME. Primary Care and Sports Medicine [facility]  Adjunct Professor of Family Medicine  [place] [employer] [employer]  Restaurant manager, fast food

## 2022-09-24 NOTE — Assessment & Plan Note (Signed)
We did an aspiration and injection with a negative crystal analysis back in March, she does have some swelling and some discomfort in the knee but her principal pain is I think radicular.

## 2022-09-28 ENCOUNTER — Other Ambulatory Visit: Payer: Self-pay | Admitting: Family Medicine

## 2022-09-28 ENCOUNTER — Encounter: Payer: Self-pay | Admitting: Family Medicine

## 2022-09-28 DIAGNOSIS — C3490 Malignant neoplasm of unspecified part of unspecified bronchus or lung: Secondary | ICD-10-CM

## 2022-09-29 ENCOUNTER — Encounter: Payer: Self-pay | Admitting: Sports Medicine

## 2022-10-14 ENCOUNTER — Encounter (INDEPENDENT_AMBULATORY_CARE_PROVIDER_SITE_OTHER): Payer: Self-pay

## 2022-10-17 ENCOUNTER — Ambulatory Visit (INDEPENDENT_AMBULATORY_CARE_PROVIDER_SITE_OTHER): Payer: Medicaid Other

## 2022-10-17 DIAGNOSIS — M5416 Radiculopathy, lumbar region: Secondary | ICD-10-CM | POA: Diagnosis not present

## 2022-10-18 ENCOUNTER — Encounter: Payer: Self-pay | Admitting: Family Medicine

## 2022-10-21 ENCOUNTER — Other Ambulatory Visit: Payer: Self-pay | Admitting: Family Medicine

## 2022-10-21 DIAGNOSIS — R413 Other amnesia: Secondary | ICD-10-CM

## 2022-10-23 ENCOUNTER — Encounter (INDEPENDENT_AMBULATORY_CARE_PROVIDER_SITE_OTHER): Payer: Medicaid Other | Admitting: Sports Medicine

## 2022-10-23 DIAGNOSIS — M5416 Radiculopathy, lumbar region: Secondary | ICD-10-CM

## 2022-10-23 NOTE — Telephone Encounter (Signed)
I spent 5 total minutes of online digital evaluation and management services in this patient-initiated request for online care. 

## 2022-10-29 NOTE — Addendum Note (Signed)
Addended by: Monica Becton on: 10/29/2022 12:47 PM   Modules accepted: Orders

## 2022-11-06 ENCOUNTER — Other Ambulatory Visit: Payer: Self-pay | Admitting: Family Medicine

## 2022-11-12 ENCOUNTER — Ambulatory Visit: Payer: Medicaid Other | Admitting: Sports Medicine

## 2022-11-18 ENCOUNTER — Ambulatory Visit
Admission: RE | Admit: 2022-11-18 | Discharge: 2022-11-18 | Disposition: A | Discharge status: Home / Self Care | Payer: Medicaid Other | Source: Ambulatory Visit | Attending: Sports Medicine | Admitting: Sports Medicine

## 2022-11-18 DIAGNOSIS — M5416 Radiculopathy, lumbar region: Secondary | ICD-10-CM

## 2022-11-18 MED ORDER — METHYLPREDNISOLONE ACETATE 40 MG/ML INJ SUSP (RADIOLOG: 80.0000 mg | Freq: Once | INTRAMUSCULAR | Status: DC

## 2022-11-18 MED ORDER — IOPAMIDOL (ISOVUE-M 200) INJECTION 41%: 1.0000 mL | Freq: Once | INTRAMUSCULAR | Status: DC

## 2022-11-18 NOTE — Discharge Instructions (Signed)

## 2022-11-19 ENCOUNTER — Encounter: Payer: Self-pay | Admitting: Sports Medicine

## 2022-12-01 ENCOUNTER — Encounter: Payer: Self-pay | Admitting: Neurology

## 2022-12-01 ENCOUNTER — Encounter: Payer: Self-pay | Admitting: Family Medicine

## 2022-12-01 DIAGNOSIS — E119 Type 2 diabetes mellitus without complications: Secondary | ICD-10-CM

## 2022-12-01 DIAGNOSIS — E7849 Other hyperlipidemia: Secondary | ICD-10-CM

## 2022-12-01 DIAGNOSIS — I10 Essential (primary) hypertension: Secondary | ICD-10-CM

## 2022-12-01 NOTE — Telephone Encounter (Signed)
Will rather after the exam and follow up on the MRI already scheduled in September.

## 2022-12-08 ENCOUNTER — Ambulatory Visit (INDEPENDENT_AMBULATORY_CARE_PROVIDER_SITE_OTHER): Payer: Medicaid Other | Admitting: Licensed Clinical Social Worker

## 2022-12-08 DIAGNOSIS — F411 Generalized anxiety disorder: Secondary | ICD-10-CM

## 2022-12-08 NOTE — Progress Notes (Signed)
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 She was later diagnosed with cancer. She has not been able to work since. She was admitted to the hospital for the first time in her life. Patient had never really been sick in her life. Mother was sick for 2 years and mother let her children know what her wishes were. She noted that when it cam to the funeral her siblings changed a lot of things related to her mother's wishes. Patient also came home to find her son on the brink of death and his best friend was deceased.  Current Symptoms/Problems: Anxiety: taps hands and feet, talks fast, jumps from subject to subject, worried, irritability, difficulty falling and staying asleep, dififculty with memory, and worry about what is going on in her brain   Patient Reported Schizophrenia/Schizoaffective Diagnosis in Past: No   Strengths: self awareness, hardworker/nursing, nurturing, parenting  Preferences: prefers music, prefers dancing, prefers time to herself, prefers time with her grandchildren  Abilities: can travel by herself,   Type of Services Patient Feels are Needed: therapy   Initial Clinical Notes/Concerns: Symptoms started around age 12 when her son had difficulty and then her diagnosis, symptoms occur daily, symptoms are moderate per patient   Mental Health Symptoms Depression:   None   Duration of Depressive symptoms: No data recorded  Mania:   None   Anxiety:    Worrying; Difficulty concentrating; Fatigue; Irritability; Restlessness; Tension (possibility of cancer causes the worry. disconnect with doctors and everything going on with that. Doctor Kathryne Sharper  pulmonologist to discuss with doctor cancer center at [facility] to discuss all of this.)   Psychosis:   None   Duration of Psychotic symptoms: No data recorded  Trauma:   None; Re-experience of traumatic event (body in garage)   Obsessions:   None   Compulsions:   None   Inattention:   None   Hyperactivity/Impulsivity:   None   Oppositional/Defiant Behaviors:   None   Emotional Irregularity:   None   Other Mood/Personality Symptoms:   None    Mental Status Exam Appearance and self-care  Stature:   Tall   Weight:   Overweight   Clothing:   Casual   Grooming:   Normal   Cosmetic use:   None   Posture/gait:   Normal   Motor activity:   Not Remarkable   Sensorium  Attention:   Normal   Concentration:   Normal   Orientation:   X5   Recall/memory:   Normal (patient is aware when she speaks and recall her own vocabulary simple words struggling to find and recall)   Affect and Mood  Affect:   Appropriate; Anxious   Mood:   Anxious   Relating  Eye contact:   Normal   Facial expression:   Responsive   Attitude toward examiner:   Cooperative   Thought and Language  Speech flow:  Normal   Thought content:   Appropriate to Mood and Circumstances   Preoccupation:   None   Hallucinations:   None   Organization:  No data recorded  [employer] of Knowledge:   Average   Intelligence:   Above Average   Abstraction:  Normal   Judgement:   Fair   Reality Testing:   Realistic   Insight:   Fair   Decision Making:   -- (doesn't trust her decision making because she knows not herself. Feels definitely since [place] incident August 1 when got ill)   Social Functioning  Social Maturity:   Responsible (connect with people but people irrate her easily unlike before.)   Social Judgement:   Normal; Naive   Stress  Stressors:   Illness; Housing; Surveyor, quantity; Work (feel displaced in a rental house. With  work feels like have to exhausted her resources if don't get to work right away going to get in trouble IRS took everything out. What makes her nervous about going back to work memory lapses unable to recall the-)   Coping Ability:   Overwhelmed; Exhausted   Skill Deficits:   -- (feels homeless wants to go home, needs a place to call home sleeping on couches airbeds got rid of furniture wants a home whether hers or find another one, dealing wth not feeling herself stress around medical issues-)   Supports:   Family (kids-talk to them livng-son, niece and patient rented home just happened. Son came to [place] to come get her. Drove with back and took straight to his apartment until got rental house hasn't slept at her house for a long time.)     Religion: Religion/Spirituality Are You A Religious Person?: Yes (feels a lot that goes on is divine.) What is Your Religious Affiliation?: Christian How Might This Affect Treatment?: Support in treatment  Leisure/Recreation: Leisure / Recreation Do You Have Hobbies?: Yes Leisure and Hobbies: travel,  Exercise/Diet: Exercise/Diet Do You Exercise?: No (did this until this happened with the lungs.) Have You Gained or Lost A Significant Amount of Weight in the Past Six Months?: No Do You Follow a Special Diet?: No Do You Have Any Trouble Sleeping?: No   CCA Employment/Education Employment/Work Situation: Employment / Work Academic librarian Situation: Retired Therapist, art is the [employer] Time Patient has Held a Job?: Patient kept her jobs for a long time 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[employer], workholic Has Patient ever Been in Equities trader?: No  Education: Education Is Patient Currently Attending School?: No Last Grade Completed: 12 Name of High School: [employer] Did Garment/textile technologist From [employer]?: Yes Did Theme park manager?: Yes What Type of College Degree Do you  Have?: Chief Operating Officer, Tax adviser, Certificat What Was Your Major?: Psychology, Nursing, Public relations account executive, Carpentry Did You Have Any Special Interests In School?: any medical, psychology Did You Have An Individualized Education Program (IIEP): No Did You Have Any Difficulty At [employer]?: No Patient's Education Has Been Impacted by Current Illness: No   CCA Family/Childhood History Family and Relationship History: Family history Marital status: Divorced Divorced, when?: 2015 What types of issues is patient dealing with in the relationship?: None Additional relationship information: Has been married 3 times Are you sexually active?: No What is your sexual orientation?: heterosexual Has your sexual activity been affected by drugs, alcohol, medication, or emotional stress?: N/A Does patient have children?: Yes How many children?: 4 How is patient's relationship with their children?: 2 sons, 2 daughters: very good  Childhood History:  Childhood History By whom was/is the patient raised?: Both parents Additional childhood history information: Both parents were in the home. Father was in the [employer] and gone a lot. Mother worked a lot. Patient took care of siblings. Patient describes  childhood as "humbing." Description of patient's relationship with caregiver when they were a child: Mother: good relationship but she was busy with work, Father: good relationship but he was busy Patient's description of current relationship with people who raised him/her: Mother: deceased, Father: deceased How were you disciplined when you got in trouble as a child/adolescent?: parents expressing disappointment was enough for her Does patient have siblings?: Yes Number of Siblings: 3 Description of patient's current relationship with siblings: 1 sister, 2 brothers: no relationship with siblings right now due to how things went when her mother's funeral didn't go as her mother wished Did patient suffer any  verbal/emotional/physical/sexual abuse as a child?: No Did patient suffer from severe childhood neglect?: No Has patient ever been sexually abused/assaulted/raped as an adolescent or adult?: No Was the patient ever a victim of a crime or a disaster?: No Witnessed domestic violence?: No Has patient been affected by domestic violence as an adult?: No  Child/Adolescent Assessment:     CCA Substance Use Alcohol/Drug Use: Alcohol / Drug Use Pain Medications: See patient MAR Prescriptions: See patient MAR Over the Counter: See patient MAR History of alcohol / drug use?: No history of alcohol / drug abuse                         ASAM's:  Six Dimensions of Multidimensional Assessment  Dimension 1:  Acute Intoxication and/or Withdrawal Potential:   Dimension 1:  Description of individual's past and current experiences of substance use and withdrawal: None  Dimension 2:  Biomedical Conditions and Complications:   Dimension 2:  Description of patient's biomedical conditions and  complications: None  Dimension 3:  Emotional, Behavioral, or Cognitive Conditions and Complications:  Dimension 3:  Description of emotional, behavioral, or cognitive conditions and complications: None  Dimension 4:  Readiness to Change:  Dimension 4:  Description of Readiness to Change criteria: None  Dimension 5:  Relapse, Continued use, or Continued Problem Potential:  Dimension 5:  Relapse, continued use, or continued problem potential critiera description: NOne  Dimension 6:  Recovery/Living Environment:  Dimension 6:  Recovery/Iiving environment criteria description: None  ASAM Severity Score: ASAM's Severity Rating Score: 0  ASAM Recommended Level of Treatment:     Substance use Disorder (SUD)    Recommendations for Services/Supports/Treatments: Recommendations for Services/Supports/Treatments Recommendations For Services/Supports/Treatments: Individual Therapy  DSM5 Diagnoses: Patient Active  Problem List   Diagnosis Date Noted   Stage III adenocarcinoma of lung (HCC) 09/24/2022   Memory deficit 09/09/2022   Primary osteoarthritis of right knee 06/09/2022   Encounter for smoking cessation counseling 06/09/2022   Anxiousness 06/09/2022   Physical exam 04/27/2022   Cervicalgia 04/06/2018   Closed head injury with concussion 04/06/2018   Fall (on) (from) other stairs and steps, initial encounter 04/06/2018   Cigarette nicotine dependence without complication 02/06/2018   Sebaceous cyst of labia 02/06/2018   Mass of axilla, left 02/04/2018   Postmenopausal 02/02/2018   History of colon polyps    Allergic rhinitis 09/04/2015   BMI 39.0-39.9,adult 09/04/2015   Primary hypertension 09/04/2015   Hyperlipidemia 09/04/2015   Type 2 diabetes mellitus without complication, without long-term current use of insulin (HCC) 04/26/2012   Right lumbar radiculopathy 04/22/2012    Patient Centered Plan: Patient is on the following Treatment Plan(s):  Treatment plan will be completed during next visit. Patient will pay attention to her triggers for anxiety.    Referrals to Alternative Service(s): Referred to Alternative Service(s):  Place:   Date:   Time:    Referred to Alternative Service(s):   Place:   Date:   Time:    Referred to Alternative Service(s):   Place:   Date:   Time:    Referred to Alternative Service(s):   Place:   Date:   Time:      Collaboration of Care: Other Sources will be identified.   Patient/Guardian was advised Release of Information must be obtained prior to any record release in order to collaborate their care with an outside provider. Patient/Guardian was advised if they have not already done so to contact the registration department to sign all necessary forms in order for [place] to release information regarding their care.   Consent: Patient/Guardian gives verbal consent for treatment and assignment of benefits for services provided during this visit.  Patient/Guardian expressed understanding and agreed to proceed.   Bynum Bellows, LCSW

## 2022-12-09 ENCOUNTER — Ambulatory Visit (INDEPENDENT_AMBULATORY_CARE_PROVIDER_SITE_OTHER): Payer: Medicaid Other | Admitting: Neurology

## 2022-12-09 ENCOUNTER — Encounter: Payer: Self-pay | Admitting: Neurology

## 2022-12-09 VITALS — BP 136/83 | HR 66 | Ht 68.0 in | Wt 253.0 lb

## 2022-12-09 DIAGNOSIS — R4189 Other symptoms and signs involving cognitive functions and awareness: Secondary | ICD-10-CM

## 2022-12-09 DIAGNOSIS — F419 Anxiety disorder, unspecified: Secondary | ICD-10-CM

## 2022-12-09 DIAGNOSIS — R410 Disorientation, unspecified: Secondary | ICD-10-CM

## 2022-12-09 LAB — CBC WITH DIFFERENTIAL/PLATELET
Basophils Absolute: 0 10*3/uL (ref 0.0–0.2)
Basos: 1 %
EOS (ABSOLUTE): 0.1 10*3/uL (ref 0.0–0.4)
Eos: 5 %
Hematocrit: 39.1 % (ref 34.0–46.6)
Hemoglobin: 12.2 g/dL (ref 11.1–15.9)
Immature Grans (Abs): 0 10*3/uL (ref 0.0–0.1)
Immature Granulocytes: 0 %
Lymphocytes Absolute: 0.9 10*3/uL (ref 0.7–3.1)
Lymphs: 33 %
MCH: 27.1 pg (ref 26.6–33.0)
MCHC: 31.2 g/dL — ABNORMAL LOW (ref 31.5–35.7)
MCV: 87 fL (ref 79–97)
Monocytes Absolute: 0.4 10*3/uL (ref 0.1–0.9)
Monocytes: 13 %
Neutrophils Absolute: 1.4 10*3/uL (ref 1.4–7.0)
Neutrophils: 48 %
Platelets: 222 10*3/uL (ref 150–450)
RBC: 4.51 x10E6/uL (ref 3.77–5.28)
RDW: 16.5 % — ABNORMAL HIGH (ref 11.7–15.4)
WBC: 2.8 10*3/uL — ABNORMAL LOW (ref 3.4–10.8)

## 2022-12-09 LAB — CMP14+EGFR
ALT: 14 IU/L (ref 0–32)
AST: 17 IU/L (ref 0–40)
Albumin: 4.3 g/dL (ref 3.9–4.9)
Alkaline Phosphatase: 82 IU/L (ref 44–121)
BUN/Creatinine Ratio: 25 (ref 12–28)
BUN: 25 mg/dL (ref 8–27)
Bilirubin Total: 0.3 mg/dL (ref 0.0–1.2)
CO2: 22 mmol/L (ref 20–29)
Calcium: 9.5 mg/dL (ref 8.7–10.3)
Chloride: 101 mmol/L (ref 96–106)
Creatinine, Ser: 0.99 mg/dL (ref 0.57–1.00)
Globulin, Total: 2.8 g/dL (ref 1.5–4.5)
Glucose: 148 mg/dL — ABNORMAL HIGH (ref 70–99)
Potassium: 4 mmol/L (ref 3.5–5.2)
Sodium: 140 mmol/L (ref 134–144)
Total Protein: 7.1 g/dL (ref 6.0–8.5)
eGFR: 64 mL/min/{1.73_m2} (ref >59)

## 2022-12-09 LAB — LIPID PANEL
Chol/HDL Ratio: 3.2 ratio (ref 0.0–4.4)
Cholesterol, Total: 177 mg/dL (ref 100–199)
HDL: 56 mg/dL (ref >39)
LDL Chol Calc (NIH): 99 mg/dL (ref 0–99)
Triglycerides: 124 mg/dL (ref 0–149)
VLDL Cholesterol Cal: 22 mg/dL (ref 5–40)

## 2022-12-09 LAB — HEMOGLOBIN A1C
Est. average glucose Bld gHb Est-mCnc: 200 mg/dL
Hgb A1c MFr Bld: 8.6 % — ABNORMAL HIGH (ref 4.8–5.6)

## 2022-12-09 MED ORDER — CITALOPRAM HYDROBROMIDE 20 MG PO TABS: 20.0000 mg | ORAL_TABLET | Freq: Every day | ORAL | 6 refills | Status: DC

## 2022-12-09 NOTE — Progress Notes (Signed)
[facility]  PATIENT: Morgan Garza DOB: [birth year]  REQUESTING CLINICIAN: Charlton Amor, DO HISTORY FROM: Patient/Daughter and Son REASON FOR VISIT: AMS/Memory loss    HISTORICAL  CHIEF COMPLAINT:  Chief Complaint  Patient presents with   New Patient (Initial Visit)    RM14 DAUGHTER (JERICA), SON (ANTHONY) PRESENT MEMORY CHANGES, MOCA:19, PT STARTED CRYING DURING MOCA SAD SHE CANNOT REMEMBER    HISTORY OF PRESENT ILLNESS:  This is a 62 year old woman past medical history of hypertension, hyperlipidemia, diabetes, anxiety who is presenting with her daughter [name] and son [name] with complaints of memory change for the past year. Patient works as a Designer, jewellery, very independent and last year she had assignment in [place], while in [place], in June 2023, she started having symptoms of abnormal sensation, possibly exposure to toxins in her AIR B&B apartment.  At that time she was also noted to have some memory changes.  She was briefly hospitalized in [place], her workup at that time was unrevealing.  She was also experiencing some cough.  Son has traveled [place] to bring her back to [place]. In October 2023 noted to have a lung nodule, this nodule was followed, she continued to experience cough and shortness of breath and had a biopsy in April 2024 and was diagnosed with adenocarcinoma of the lung.  Of note patient was a smoker and she recently quit.  She was started on radiation and chemotherapy, her last chemo was on August 12 and her last radiation August 19. Family reports since June 2023, she started developing memory problem difficulty with recall, she gets tired easily distracted that was the reason why son has to travel to [place] to bring her back.  Her cognitive impairment has not improved, actually got worse, family reported that she is forgetful, short patience, anxious, she has to write everything down.  She made mistakes with  her bills, paying the bills multiple times.  Son did report an event where she was trying to pump gas but did not know how to pump gas properly, she was driving with low air in her tires and son has to put air in the tires.  When driving with her grandkids into a very family area, she will still need directions to get home. 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    TBI:   No past history of TBI Stroke:   no past history of stroke Seizures:   no past history of seizures Sleep:   no history of sleep apnea.  Mood:  patient denies anxiety and depression Family history of Dementia: Denies  Functional status:  Patient lives with son. Cooking: not a lot  Cleaning: no issues  Shopping: no issues  Bathing: no issues  Toileting: no issues  Driving: yes, but had period of confusion  Bills: Paid bills multiple times  Medications: no issues  Ever left the stove on by accident?: yes Forget how to use items around the house?: yes  Getting lost going to familiar places?: Yes Forgetting loved ones names?: No  Word finding difficulty? Yes Sleep: Difficulty with sleep    OTHER MEDICAL CONDITIONS: Diabetes Mellitus, Hypertension, Hyperlipidemia, Anxiety   REVIEW OF SYSTEMS: Full 14 system review of systems performed and negative with exception of: As noted  in the HPI  ALLERGIES: Allergies  Allergen Reactions   Bupropion Hives        Varenicline Other (See Comments)    Vision changes, "everything was enhanced"    HOME MEDICATIONS: Outpatient Medications Prior to Visit  Medication Sig Dispense Refill   amLODipine (NORVASC) 10 MG tablet TAKE 1 TABLET BY MOUTH DAILY 90 tablet 0   atorvastatin (LIPITOR) 20 MG tablet Take 1 tablet (20 mg total) by mouth daily. 90  tablet 3   Blood Glucose Monitoring Suppl (ONE TOUCH ULTRA 2) w/Device KIT Dx DM E11.8. Check blood sugar 3 times daily. 1 kit 0   Cholecalciferol (VITAMIN D3) 5000 units TABS Take 1,000 Units by mouth.     diclofenac Sodium (VOLTAREN) 1 % GEL Apply 2 g topically 4 (four) times daily. Apply 2g topically to knee up to four times daily 100 g 2   gabapentin (NEURONTIN) 300 MG capsule One tab PO qHS for a week, then BID for a week, then TID. May double weekly to a max of 3,600mg /day 90 capsule 3   glipiZIDE (GLUCOTROL XL) 10 MG 24 hr tablet Take 1 tablet (10 mg total) by mouth daily with breakfast. 90 tablet 2   glucose blood (ONETOUCH ULTRA) test strip Dx DM E11.8. Check blood sugar 3 times daily. 100 each PRN   glucose blood test strip Use one strip to test blood sugar.     Lancets 30G MISC Dx DM E11.8. Check blood sugar 3 times daily. 100 each PRN   lisinopril-hydrochlorothiazide (ZESTORETIC) 20-25 MG tablet Take 1 tablet by mouth daily. 90 tablet 3   metFORMIN (GLUCOPHAGE-XR) 500 MG 24 hr tablet TAKE TWO TABLETS BY MOUTH EVERY MORNING WITH BREAKFAST 180 tablet 3   Semaglutide (RYBELSUS) 3 MG TABS Take 1 tablet (3 mg total) by mouth daily. 30 tablet 2   nicotine (NICODERM CQ - DOSED IN MG/24 HOURS) 14 mg/24hr patch Place 1 patch (14 mg total) onto the skin daily. 28 patch 1   predniSONE (DELTASONE) 50 MG tablet One tab PO daily for 5 days. 5 tablet 0   No facility-administered medications prior to visit.    PAST MEDICAL HISTORY: Past Medical History:  Diagnosis Date   Anemia    Arthritis    Diabetes mellitus without complication (HCC)    History of colon polyps    Hyperlipidemia    Hypertension    Obesity     PAST SURGICAL HISTORY: Past Surgical History:  Procedure Laterality Date   COLONOSCOPY  07/07/2012   [facility]   NO PAST SURGERIES      FAMILY HISTORY: Family History  Problem Relation Age of Onset   Hypertension Mother    COPD Mother    Cancer Father     Gastric cancer Father    Diabetes Daughter    Testicular cancer Son    Diabetes Son    Esophageal cancer Neg Hx    Colon cancer Neg Hx     SOCIAL HISTORY: Social History   Socioeconomic History   Marital status: Divorced    Spouse name: Not on file   Number of children: 4   Years of education: Not on file   Highest education level: Bachelor's degree (e.g., BA, AB, BS)  Occupational History    Comment: [employer]  Tobacco Use   Smoking status: Former    Current packs/day: 0.50    Average packs/day: 0.5 packs/day for 1 year (0.5 ttl pk-yrs)    Types: Cigarettes  Smokeless tobacco: Never  Vaping Use   Vaping status: Never Used  Substance and Sexual Activity   Alcohol use: Yes    Alcohol/week: 1.0 standard drink of alcohol    Types: 1 Standard drinks or equivalent per week    Comment: ocassionally   Drug use: Never   Sexual activity: Not on file  Other Topics Concern   Not on file  Social History Narrative   Not on file   Social Determinants of Health   Financial Resource Strain: High Risk (08/01/2022)   Overall Financial Resource Strain (CARDIA)    Difficulty of Paying Living Expenses: Very hard  Food Insecurity: Food Insecurity Present (08/01/2022)   Hunger Vital Sign    Worried About Running Out of Food in the Last Year: Sometimes true    Ran Out of Food in the Last Year: Often true  Transportation Needs: No Transportation Needs (08/01/2022)   PRAPARE - Administrator, Civil Service (Medical): No    Lack of Transportation (Non-Medical): No  Physical Activity: Unknown (08/01/2022)   Exercise Vital Sign    Days of Exercise per Week: 0 days    Minutes of Exercise per Session: Not on file  Stress: Stress Concern Present (08/01/2022)   [employer] of Occupational Health - Occupational Stress Questionnaire    Feeling of Stress : To some extent  Social Connections: Moderately Isolated (08/01/2022)   Social Connection and Isolation Panel [NHANES]     Frequency of Communication with Friends and Family: More than three times a week    Frequency of Social Gatherings with Friends and Family: More than three times a week    Attends Religious Services: 1 to 4 times per year    Active Member of [employer] or Organizations: No    Attends Engineer, structural: Not on file    Marital Status: Divorced  Intimate Partner Violence: Not At Risk (05/28/2022)   Received from [employer], [facility]   HITS    Over the last 12 months how often did your partner physically hurt you?: 1    Over the last 12 months how often did your partner insult you or talk down to you?: 1    Over the last 12 months how often did your partner threaten you with physical harm?: 1    Over the last 12 months how often did your partner scream or curse at you?: 1    PHYSICAL EXAM  GENERAL EXAM/CONSTITUTIONAL: Vitals:  Vitals:   12/09/22 1525  BP: 136/83  Pulse: 66  Weight: 253 lb (114.8 kg)  Height: 5\' 8"  (1.727 m)   Body mass index is 38.47 kg/m. Wt Readings from Last 3 Encounters:  12/10/22 251 lb 12 oz (114.2 kg)  12/09/22 253 lb (114.8 kg)  09/09/22 253 lb 12 oz (115.1 kg)   Patient is in no distress; well developed, nourished and groomed; neck is supple  MUSCULOSKELETAL: Gait, strength, tone, movements noted in Neurologic exam below  NEUROLOGIC: MENTAL STATUS:      No data to display            12/09/2022    3:28 PM  Montreal Cognitive Assessment   Visuospatial/ Executive (0/5) 3  Naming (0/3) 2  Attention: Read list of digits (0/2) 1  Attention: Read list of letters (0/1) 1  Attention: Serial 7 subtraction starting at 100 (0/3) 3  Language: Repeat phrase (0/2) 0  Language : Fluency (0/1) 1  Abstraction (0/2) 2  Delayed Recall (  0/5) 2  Orientation (0/6) 4  Total 19     CRANIAL NERVE:  2nd, 3rd, 4th, 6th- visual fields full to confrontation, extraocular muscles intact, no nystagmus 5th - facial sensation symmetric 7th -  facial strength symmetric 8th - hearing intact 9th - palate elevates symmetrically, uvula midline 11th - shoulder shrug symmetric 12th - tongue protrusion midline  MOTOR:  normal bulk and tone, full strength in the BUE, BLE  SENSORY:  normal and symmetric to light touch  COORDINATION:  finger-nose-finger, fine finger movements normal  GAIT/STATION:  normal   DIAGNOSTIC DATA (LABS, IMAGING, TESTING) - I reviewed patient records, labs, notes, testing and imaging myself where available.  Lab Results  Component Value Date   WBC 2.8 (L) 12/08/2022   HGB 12.2 12/08/2022   HCT 39.1 12/08/2022   MCV 87 12/08/2022   PLT 222 12/08/2022      Component Value Date/Time   NA 140 12/08/2022 1014   K 4.0 12/08/2022 1014   CL 101 12/08/2022 1014   CO2 22 12/08/2022 1014   GLUCOSE 148 (H) 12/08/2022 1014   GLUCOSE 108 (H) 09/09/2022 1138   BUN 25 12/08/2022 1014   CREATININE 0.99 12/08/2022 1014   CREATININE 0.87 09/09/2022 1138   CALCIUM 9.5 12/08/2022 1014   PROT 7.1 12/08/2022 1014   ALBUMIN 4.3 12/08/2022 1014   AST 17 12/08/2022 1014   ALT 14 12/08/2022 1014   ALKPHOS 82 12/08/2022 1014   BILITOT 0.3 12/08/2022 1014   GFRNONAA 78 06/06/2020 0000   GFRAA 90 06/06/2020 0000   Lab Results  Component Value Date   CHOL 177 12/08/2022   HDL 56 12/08/2022   LDLCALC 99 12/08/2022   TRIG 124 12/08/2022   CHOLHDL 3.2 12/08/2022   Lab Results  Component Value Date   HGBA1C 8.6 (H) 12/08/2022   Lab Results  Component Value Date   VITAMINB12 587 09/09/2022   No results found for: "TSH"  MRI Brain with and without 12/08/2022 1.  No acute intracranial abnormality.  2.  No specific evidence for intracranial metastatic disease.  3.  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 Nonemergent CTA of the head could further differentiate and  exclude a small arterial aneurysm.     ASSESSMENT AND PLAN  62 y.o. year old female with past medical history including hypertension, hyperlipidemia, diabetes mellitus and anxiety who is presenting with 1 year history of memory loss, confusion, difficulty with recall, having trouble with direction.  Her workup so far has been negative, will add additional labs to rule out rapidly progressive dementia including paraneoplastic and autoimmune panels, inflammatory markers, Lyme disease and copper studies.  Will also obtain routine EEG.  Her most recent MRI did not show any acute abnormality other than an incidental 3 mm outpouching favoring a venous anomaly.  I will contact the patient/family to go over the result.  Her MoCA today was 19, consistent with cognitive impairment.  Advised her to follow-up with psychiatry regarding her anxiety, I will start her with 20 mg Celexa and psychiatry can further manage as needed.  I will see them in 6 months for follow-up or sooner if worse.   1. Cognitive impairment      Patient Instructions  Complete the workup for rapidly progressive dementia including paraneoplastic and autoimmune panels, and further markers including ANA, ESR CRP, Lyme disease, copper studies  Routine EEG Continue to follow with your doctors Return in 6 months or sooner if worse   Orders Placed This Encounter  Procedures   Lyme Disease Serology w/Reflex   Sedimentation Rate   C-reactive Protein   ANA Comprehensive Panel   Ceruloplasmin   Sjogren's syndrome antibods(ssa + ssb)   Paraneoplastic Ab   Autoimmune Neurology Ab   EEG adult    Meds ordered this encounter  Medications   citalopram (CELEXA) 20 MG tablet    Sig: Take 1 tablet (20 mg total) by mouth daily.    Dispense:  30 tablet    Refill:  6    Return in about 6 months (around 06/08/2023).  I have spent a total of 70 minutes dedicated to this patient today, preparing to see patient, performing a medically  appropriate examination and evaluation, ordering tests and/or medications and procedures, and counseling and educating the patient/family/caregiver; independently interpreting result and communicating results to the family/patient/caregiver; and documenting clinical information in the electronic medical record.   Windell Norfolk, MD 12/12/2022, 8:19 AM  [facility] [address] [place], [place] [address] [phone]

## 2022-12-10 ENCOUNTER — Ambulatory Visit (INDEPENDENT_AMBULATORY_CARE_PROVIDER_SITE_OTHER): Payer: Medicaid Other | Admitting: Family Medicine

## 2022-12-10 ENCOUNTER — Encounter: Payer: Self-pay | Admitting: Family Medicine

## 2022-12-10 VITALS — BP 144/78 | HR 66 | Ht 68.0 in | Wt 251.8 lb

## 2022-12-10 DIAGNOSIS — E119 Type 2 diabetes mellitus without complications: Secondary | ICD-10-CM

## 2022-12-10 DIAGNOSIS — Z7984 Long term (current) use of oral hypoglycemic drugs: Secondary | ICD-10-CM

## 2022-12-10 DIAGNOSIS — F5101 Primary insomnia: Secondary | ICD-10-CM | POA: Diagnosis not present

## 2022-12-10 MED ORDER — TRAZODONE HCL 50 MG PO TABS
25.0000 mg | ORAL_TABLET | Freq: Every evening | ORAL | 1 refills | Status: DC | PRN
Start: 2022-12-10 — End: 2023-02-23

## 2022-12-10 MED ORDER — RYBELSUS 7 MG PO TABS: 7.0000 mg | ORAL_TABLET | Freq: Every day | ORAL | 1 refills | Status: DC

## 2022-12-10 NOTE — Assessment & Plan Note (Signed)
-   unclear etiology whether it be from chemo side effects vs anxiety. Will do a trial of trazodone to see if we can get her some sleep. Neurology put patient on celexa for anxiety and she just started it so hopefully this will also help - follow up in one month for efficacy

## 2022-12-10 NOTE — Progress Notes (Signed)
Established patient visit   Patient: Morgan Garza   DOB: 1960/05/10   62 y.o. Female  MRN: [id number] Visit Date: 12/10/2022  Today's healthcare provider: Charlton Amor, DO   Chief Complaint  Patient presents with   Follow-up    SUBJECTIVE    Chief Complaint  Patient presents with   Follow-up   HPI  Pt presents for follow up. Pmh significant for adenocarcinoma of left lung, stage 3 that has been treated with chemo   Insomnia - pt has been unable to sleep and needs something to help her. Was told this could be a side effect of her chemo  T2DM - A1c elevated - pt needs to increase to rybelsus 7mg   Review of Systems  Constitutional:  Negative for activity change, fatigue and fever.  Respiratory:  Negative for cough and shortness of breath.   Cardiovascular:  Negative for chest pain.  Gastrointestinal:  Negative for abdominal pain.  Genitourinary:  Negative for difficulty urinating.       Current Meds  Medication Sig   Semaglutide (RYBELSUS) 7 MG TABS Take 1 tablet (7 mg total) by mouth daily.   traZODone (DESYREL) 50 MG tablet Take 0.5-1 tablets (25-50 mg total) by mouth at bedtime as needed for sleep.    OBJECTIVE    LMP 11/09/2010   Physical Exam Vitals and nursing note reviewed.  Constitutional:      General: She is not in acute distress.    Appearance: Normal appearance.  HENT:     Head: Normocephalic and atraumatic.     Right Ear: External ear normal.     Left Ear: External ear normal.     Nose: Nose normal.  Eyes:     Conjunctiva/sclera: Conjunctivae normal.  Cardiovascular:     Rate and Rhythm: Normal rate and regular rhythm.  Pulmonary:     Effort: Pulmonary effort is normal.     Breath sounds: Normal breath sounds.  Neurological:     General: No focal deficit present.     Mental Status: She is alert and oriented to person, place, and time.  Psychiatric:        Mood and Affect: Mood normal.        Behavior: Behavior normal.         Thought Content: Thought content normal.        Judgment: Judgment normal.        ASSESSMENT & PLAN    Problem List Items Addressed This Visit       Endocrine   Type 2 diabetes mellitus without complication, without long-term current use of insulin (HCC)    Increase rybelsus to 7mg , pt tolerated 3mg  well       Relevant Medications   Semaglutide (RYBELSUS) 7 MG TABS     Other   Primary insomnia - Primary    - unclear etiology whether it be from chemo side effects vs anxiety. Will do a trial of trazodone to see if we can get her some sleep. Neurology put patient on celexa for anxiety and she just started it so hopefully this will also help - follow up in one month for efficacy      Relevant Medications   traZODone (DESYREL) 50 MG tablet    Return in about 4 weeks (around 01/07/2023).      Meds ordered this encounter  Medications   traZODone (DESYREL) 50 MG tablet    Sig: Take 0.5-1 tablets (25-50 mg total) by mouth at bedtime as  needed for sleep.    Dispense:  30 tablet    Refill:  1   Semaglutide (RYBELSUS) 7 MG TABS    Sig: Take 1 tablet (7 mg total) by mouth daily.    Dispense:  30 tablet    Refill:  1    No orders of the defined types were placed in this encounter.    Charlton Amor, DO  [facility] at [facility] [phone] (phone) [phone] (fax)  [facility]

## 2022-12-10 NOTE — Assessment & Plan Note (Signed)
Increase rybelsus to 7mg , pt tolerated 3mg  well

## 2022-12-12 ENCOUNTER — Encounter: Payer: Self-pay | Admitting: Neurology

## 2022-12-12 NOTE — Patient Instructions (Addendum)
Complete the workup for rapidly progressive dementia including paraneoplastic and autoimmune panels, and further markers including ANA, ESR CRP, Lyme disease, copper studies  Routine EEG Continue to follow with your doctors Return in 6 months or sooner if worse

## 2022-12-14 ENCOUNTER — Encounter: Payer: Self-pay | Admitting: Sports Medicine

## 2022-12-14 ENCOUNTER — Ambulatory Visit (INDEPENDENT_AMBULATORY_CARE_PROVIDER_SITE_OTHER): Payer: Medicaid Other | Admitting: Licensed Clinical Social Worker

## 2022-12-14 DIAGNOSIS — F411 Generalized anxiety disorder: Secondary | ICD-10-CM | POA: Diagnosis not present

## 2022-12-14 NOTE — Progress Notes (Signed)
   THERAPIST PROGRESS NOTE  Session Time: 11:00 am-11:45 am  Type of Therapy: Individual Therapy  Purpose of Session: Cortne will manage anxiety as evidenced by processing the past and grieving loss, cope with current health issues, cope with anxious thoughts, and cope with life circumstances and be more independent for 5 out of 7 days for 60 days   ProgressTowards Goals: Initial  Interventions: Therapist utilized CBT, solution focused brief therapy, and ACT to address anxiety. Therapist administered the PHQ9 and GAD7 to patient during session. Therapist provided support and empathy to patient during session. Therapist worked with patient to complete the treatment plan. Therapist provided psychoeducation to patient on CBT.   Effectiveness: Patient was oriented x4 (person, place, situation, and time). Patient was casually dressed, and appropriately groomed. Patient was alert, engaged, pleasant, and cooperative. Patient continues to struggle with her cognition. She struggles with remembering certain words or as she puts it her "own vocabulary." Patient is frustrated by this. She has been worried about dementia and alzheimer's but her neurologist is working to rule those diagnosis out. Patient was comforted by this. Patient understood CBT and how thoughts influence feelings and behaviors. Patient feels like other had expectations for her which put unreasonable expectations on her that she didn't ask for or necessarily want. She admits this has benefits but also he pressures. Patient feels like it is hard for her to accept where she is at with her health and cognitively. She continues to spend money like she is still working due to reality not "hitting" her yet. Patient is going to pay attention to thoughts and work on acceptance.  Patient engaged in session. She responded well to interventions. Patient continues to meet criteria for Generalized Anxiety Disorder. Patient will continue in outpatient  therapy due to being the least restrictive service to meet her needs. Patient made minimal progress on her goals.   Suicidal/Homicidal: Negativewithout intent/plan  Plan: Return again in 2-4 weeks.  Diagnosis: Generalized anxiety disorder  Collaboration of Care: Other sources to be identified.   Patient/Guardian was advised Release of Information must be obtained prior to any record release in order to collaborate their care with an outside provider. Patient/Guardian was advised if they have not already done so to contact the registration department to sign all necessary forms in order for [place] to release information regarding their care.   Consent: Patient/Guardian gives verbal consent for treatment and assignment of benefits for services provided during this visit. Patient/Guardian expressed understanding and agreed to proceed.   Bynum Bellows, LCSW 12/14/2022

## 2022-12-15 LAB — AUTOIMMUNE NEUROLOGY AB

## 2022-12-15 LAB — ANA COMPREHENSIVE PANEL
Anti JO-1: <0.2 AI (ref 0.0–0.9)
Centromere Ab Screen: <0.2 AI (ref 0.0–0.9)
Chromatin Ab SerPl-aCnc: <0.2 AI (ref 0.0–0.9)
ENA RNP Ab: 0.5 AI (ref 0.0–0.9)
ENA SM Ab Ser-aCnc: <0.2 AI (ref 0.0–0.9)
ENA SSA (RO) Ab: 0.2 AI (ref 0.0–0.9)
ENA SSB (LA) Ab: <0.2 AI (ref 0.0–0.9)
Scleroderma (Scl-70) (ENA) Antibody, IgG: <0.2 AI (ref 0.0–0.9)
dsDNA Ab: 1 [IU]/mL (ref 0–9)

## 2022-12-15 LAB — C-REACTIVE PROTEIN: CRP: 3 mg/L (ref 0–10)

## 2022-12-15 LAB — LYME DISEASE SEROLOGY W/REFLEX: Lyme Total Antibody EIA: NEGATIVE

## 2022-12-15 LAB — CERULOPLASMIN: Ceruloplasmin: 46.2 mg/dL — ABNORMAL HIGH (ref 19.0–39.0)

## 2022-12-15 LAB — SEDIMENTATION RATE: Sed Rate: 38 mm/h (ref 0–40)

## 2022-12-21 ENCOUNTER — Encounter: Payer: Self-pay | Admitting: Neurology

## 2022-12-21 ENCOUNTER — Ambulatory Visit (INDEPENDENT_AMBULATORY_CARE_PROVIDER_SITE_OTHER): Payer: Medicaid Other | Admitting: Neurology

## 2022-12-21 DIAGNOSIS — R4182 Altered mental status, unspecified: Secondary | ICD-10-CM | POA: Diagnosis not present

## 2022-12-21 DIAGNOSIS — R4189 Other symptoms and signs involving cognitive functions and awareness: Secondary | ICD-10-CM

## 2022-12-21 NOTE — Procedures (Signed)
   History:  51 with rapidly progressive memory loss   EEG classification:  Awake and asleep  Duration: 25 minutes   Technical aspects: This EEG study was done with scalp electrodes positioned according to the 10-20 International system of electrode placement. Electrical activity was reviewed with band pass filter of 1-70Hz , sensitivity of 7 uV/mm, display speed of 56mm/sec with a 60Hz  notched filter applied as appropriate. EEG data were recorded continuously and digitally stored.   Description of the recording: The background rhythms of this recording consists of a fairly well modulated medium amplitude background activity of 8.5 Hz. As the record progresses, the patient initially is in the waking state, but appears to enter the early stage II sleep during the recording, with rudimentary sleep spindles and vertex sharp wave activity seen. During the wakeful state, photic stimulation was performed, and no abnormal responses were seen. Hyperventilation was not performed. No epileptiform discharges seen during this recording. There was no focal slowing.   Abnormality: None   Impression: This is a normal EEG recording in the waking and sleeping state. No evidence of interictal epileptiform discharges. Normal EEGs, however, do not rule out epilepsy.    Windell Norfolk, MD [facility]

## 2022-12-22 NOTE — Telephone Encounter (Signed)
Spoke with patient, informed her that we are waiting for additional results before ordering additional tests.

## 2022-12-28 ENCOUNTER — Encounter: Payer: Self-pay | Admitting: Neurology

## 2022-12-28 ENCOUNTER — Encounter: Payer: Self-pay | Admitting: Sports Medicine

## 2022-12-28 ENCOUNTER — Encounter: Payer: Self-pay | Admitting: Family Medicine

## 2022-12-30 ENCOUNTER — Encounter: Payer: Self-pay | Admitting: Sports Medicine

## 2022-12-31 ENCOUNTER — Encounter: Payer: Self-pay | Admitting: Neurology

## 2022-12-31 NOTE — Discharge Instructions (Signed)

## 2023-01-01 ENCOUNTER — Inpatient Hospital Stay
Admission: RE | Admit: 2023-01-01 | Discharge: 2023-01-01 | Disposition: A | Discharge status: Home / Self Care | Payer: Medicaid Other | Source: Ambulatory Visit | Attending: Sports Medicine | Admitting: Sports Medicine

## 2023-01-01 ENCOUNTER — Ambulatory Visit (INDEPENDENT_AMBULATORY_CARE_PROVIDER_SITE_OTHER): Payer: Medicaid Other | Admitting: Licensed Clinical Social Worker

## 2023-01-01 DIAGNOSIS — F411 Generalized anxiety disorder: Secondary | ICD-10-CM

## 2023-01-02 NOTE — Progress Notes (Signed)
THERAPIST PROGRESS NOTE  Session Time: 10:00 am-10:45 am  Type of Therapy: Individual Therapy  Purpose of session/Treatment Goals addressed: Morgan Garza will manage anxiety as evidenced by processing the past and grieving loss, cope with current health issues, cope with anxious thoughts, and cope with life circumstances and be more independent for 5 out of 7 days for 60 days   Interventions: Therapist utilized CBT, ACT, and Solution focused brief therapy to address mood and anxiety. Therapist provided support and empathy to patient during session. Therapist administered the PGQ9 and the GAD7. Therapist processed patient's feelings to identify triggers for mood and anxiety.  Therapist worked with patient to cope with her current health.    Effectiveness: Patient was oriented x4 (person, place, situation, and time) . Patient was  Anxious, Appropriate, and Depressed. Patient was Well Groomed. Patient completed the PHQ9 with a score of 21 indicating severe depressive symptoms. Patient completed the GAD7 with a score of 21 indicating severe anxious symptoms. Patient recently had a PET scan. Patient thought it would be the last thing in her treatment and she was expecting to get the all clear. Patient was told that there is a golf ball sized mass on the back of her leg. She was told this in front of her children. Patient was in shock. She noted that the chemo doctor started making plans for treatment. Patient is going to get a second opinion. She wants confirmation that it is actually cancer. Patient has been frustrated with the level of care she has given. She wants the doctors to be more thorough with their testing. Patient is feeling overwhelmed and experiencing depressive symptoms. Patient is going to speak to her primary care about her medication for anxiety. Patient is going to focus on cutting herself some slack and holding doctors accountable as well as advocate for herself in her care.   Patient engaged  in session. Patient responded well to interventions. Patient continues to meet criteria for Generalized anxiety disorder  Patient will continue in outpatient therapy due to being the least restrictive service to meet her needs. Patient made minimal progress on her goals at this time.       01/01/2023   10:08 AM 12/08/2022    9:46 PM 09/09/2022    2:09 PM  Depression screen PHQ 2/9  Decreased Interest 3 1 3   Down, Depressed, Hopeless 3 0 3  PHQ - 2 Score 6 1 6   Altered sleeping 3  0  Tired, decreased energy 3  3  Change in appetite 3  3  Feeling bad or failure about yourself  0  0  Trouble concentrating 3  0  Moving slowly or fidgety/restless 3  3  Suicidal thoughts 0  0  PHQ-9 Score 21  15  Difficult doing work/chores   Somewhat difficult       01/01/2023   10:08 AM 09/09/2022    2:10 PM 04/01/2018    3:10 PM 02/02/2018   11:17 AM  GAD 7 : Generalized Anxiety Score  Nervous, Anxious, on Edge 3 3 0 3  Control/stop worrying 3 3 0 3  Worry too much - different things 3 3 3 3   Trouble relaxing 3 2 3 3   Restless 3 2 1 3   Easily annoyed or irritable 3 3 0 0  Afraid - awful might happen 3 1 1  0  Total GAD 7 Score 21 17 8 15   Anxiety Difficulty Very difficult Somewhat difficult Somewhat difficult       Suicidal/Homicidal:  Negative without intent/plan   Recent stressful life event  Protective Factors: positive social support, responsibility to others (children, family), and hope for the future  Plan: Return in 2-4 weeks.  Diagnosis: Axis I: Generalized anxiety disorder    Collaboration of Care: Other PCP to consult about patient's progress and needs.   Patient/Guardian was advised Release of Information must be obtained prior to any record release in order to collaborate their care with an outside provider. Patient/Guardian was advised if they have not already done so to contact the registration department to sign all necessary forms in order for [place] to release information regarding  their care.   Consent: Patient/Guardian gives verbal consent for treatment and assignment of benefits for services provided during this visit. Patient/Guardian expressed understanding and agreed to proceed.

## 2023-01-03 ENCOUNTER — Other Ambulatory Visit: Payer: Self-pay | Admitting: Neurology

## 2023-01-03 DIAGNOSIS — C3490 Malignant neoplasm of unspecified part of unspecified bronchus or lung: Secondary | ICD-10-CM

## 2023-01-03 DIAGNOSIS — R413 Other amnesia: Secondary | ICD-10-CM

## 2023-01-03 DIAGNOSIS — R4189 Other symptoms and signs involving cognitive functions and awareness: Secondary | ICD-10-CM

## 2023-01-04 ENCOUNTER — Ambulatory Visit (INDEPENDENT_AMBULATORY_CARE_PROVIDER_SITE_OTHER): Payer: Medicaid Other | Admitting: Licensed Clinical Social Worker

## 2023-01-04 ENCOUNTER — Other Ambulatory Visit: Payer: Self-pay | Admitting: Family Medicine

## 2023-01-04 ENCOUNTER — Telehealth: Payer: Self-pay | Admitting: General Practice

## 2023-01-04 DIAGNOSIS — F411 Generalized anxiety disorder: Secondary | ICD-10-CM | POA: Diagnosis not present

## 2023-01-04 MED ORDER — ALPRAZOLAM 0.25 MG PO TABS: 0.2500 mg | ORAL_TABLET | Freq: Two times a day (BID) | ORAL | 0 refills | Status: DC | PRN

## 2023-01-04 NOTE — Progress Notes (Signed)
THERAPIST PROGRESS NOTE  Session Time: 10:00 am-10:45 am  Type of Therapy: Individual Therapy  Purpose of session/Treatment Goals addressed: Malik will manage anxiety as evidenced by processing the past and grieving loss, cope with current health issues, cope with anxious thoughts, and cope with life circumstances and be more independent for 5 out of 7 days for 60 days. [name] will increase coping skills to manage depression and improve ability to perform daily activities (LTG)   Interventions: Therapist utilized CBT, ACT, and Solution focused brief therapy to address mood and anxiety. Therapist provided support and empathy to patient during session. Therapist administered the PGQ9 and the GAD7.  Therapist worked with patient individually to identify the major components of a recent episode of anxiety: physical symptoms, major thoughts and images, and major behaviors they experienced. Therapist worked with patient to identify 3 personal goals for managing their anxiety to work on during current treatment.     Effectiveness: Patient was oriented x4 (person, place, situation, and time) . Patient was  Anxious, Appropriate, and Depressed. Patient was Well Groomed. Patient completed the PHQ9 with a score of 21 indicating severe depressive symptoms. Patient completed the GAD7 with a score of 21 indicating severe anxious symptoms. Patient noted that after last session she became dizzy and went to the ER. Patient feels like she got worked up during last session expressing herself. Patient got home and sat in her car for 30 minutes. Then tired to get out and got dizzy. Patient went to the ER and everything was ok. She noted that during last session she had heart palpitations, etc. Patient was not experiencing any physical symptoms of anxiety during session currently. Patient had another appointment/phone call over the weekend where her mind went blank. She got a call from the neurologist and they want  to do a spinal tap. She agreed to it but after the call had questions. Patient is going to write down her questions for him, and write down general questions that she or her children can ask the next time she talks to a professional who suggests a referral, course of treatment, medication, etc. This will reduce her stress. Patient is also going to work on "planning" out on a calendar her bills so that she doesn't spend hours "bill planning" at the beginning of the month and getting stressed.   Patient engaged in session. Patient responded well to interventions. Patient continues to meet criteria for Generalized anxiety disorder  Patient will continue in outpatient therapy due to being the least restrictive service to meet her needs. Patient made minimal progress on her goals at this time.        01/04/2023   10:20 AM 01/01/2023   10:08 AM 12/08/2022    9:46 PM  Depression screen PHQ 2/9  Decreased Interest 3 3 1   Down, Depressed, Hopeless 3 3 0  PHQ - 2 Score 6 6 1   Altered sleeping 3 3   Tired, decreased energy 3 3   Change in appetite 3 3   Feeling bad or failure about yourself  0 0   Trouble concentrating 3 3   Moving slowly or fidgety/restless 3 3   Suicidal thoughts 0 0   PHQ-9 Score 21 21        01/04/2023   10:21 AM 01/01/2023   10:08 AM 09/09/2022    2:10 PM 04/01/2018    3:10 PM  GAD 7 : Generalized Anxiety Score  Nervous, Anxious, on Edge 3 3 3  0  Control/stop worrying 3 3 3  0  Worry too much - different things 3 3 3 3   Trouble relaxing 3 3 2 3   Restless 3 3 2 1   Easily annoyed or irritable 3 3 3  0  Afraid - awful might happen 3 3 1 1   Total GAD 7 Score 21 21 17 8   Anxiety Difficulty Very difficult Very difficult Somewhat difficult Somewhat difficult      Suicidal/Homicidal:  Negative without intent/plan   Recent stressful life event  Protective Factors: positive social support, responsibility to others (children, family), and hope for the future  Plan: Return in 2-4  weeks.  Diagnosis: Axis I: Generalized anxiety disorder    Collaboration of Care: Other PCP to consult about patient's progress and needs.   Patient/Guardian was advised Release of Information must be obtained prior to any record release in order to collaborate their care with an outside provider. Patient/Guardian was advised if they have not already done so to contact the registration department to sign all necessary forms in order for [place] to release information regarding their care.   Consent: Patient/Guardian gives verbal consent for treatment and assignment of benefits for services provided during this visit. Patient/Guardian expressed understanding and agreed to proceed.

## 2023-01-04 NOTE — Transitions of Care (Post Inpatient/ED Visit) (Signed)
01/04/2023  Name: Morgan Garza MRN: [id number] DOB: 24-Jul-1960  Today's TOC FU Call Status: Today's TOC FU Call Status:: Unsuccessful Call (1st Attempt) Unsuccessful Call (1st Attempt) Date: 01/04/23  Attempted to reach the patient regarding the most recent Inpatient/ED visit.  Follow Up Plan: Additional outreach attempts will be made to reach the patient to complete the Transitions of Care (Post Inpatient/ED visit) call.   Signature Modesto Charon, Control and instrumentation engineer

## 2023-01-05 NOTE — Transitions of Care (Post Inpatient/ED Visit) (Signed)
01/05/2023  Name: Morgan Garza MRN: [id number] DOB: 02-26-1961  Today's TOC FU Call Status: Today's TOC FU Call Status:: Unsuccessful Call (2nd Attempt) Unsuccessful Call (1st Attempt) Date: 01/04/23 Unsuccessful Call (2nd Attempt) Date: 01/05/23  Attempted to reach the patient regarding the most recent Inpatient/ED visit.  Follow Up Plan: Additional outreach attempts will be made to reach the patient to complete the Transitions of Care (Post Inpatient/ED visit) call.   Signature Modesto Charon, Control and instrumentation engineer

## 2023-01-06 NOTE — Transitions of Care (Post Inpatient/ED Visit) (Signed)
01/06/2023  Name: Venola Felling MRN: [id number] DOB: 12/21/1960  Today's TOC FU Call Status: Today's TOC FU Call Status:: Unsuccessful Call (3rd Attempt) Unsuccessful Call (1st Attempt) Date: 01/04/23 Unsuccessful Call (2nd Attempt) Date: 01/05/23 Unsuccessful Call (3rd Attempt) Date: 01/06/23  Attempted to reach the patient regarding the most recent Inpatient/ED visit.  Follow Up Plan: No further outreach attempts will be made at this time. We have been unable to contact the patient.  Signature Modesto Charon, Control and instrumentation engineer

## 2023-01-07 ENCOUNTER — Ambulatory Visit: Payer: Medicaid Other | Admitting: Family Medicine

## 2023-01-07 ENCOUNTER — Encounter: Payer: Self-pay | Admitting: Family Medicine

## 2023-01-07 VITALS — BP 121/71 | HR 109 | Ht 68.0 in | Wt 250.8 lb

## 2023-01-07 DIAGNOSIS — E119 Type 2 diabetes mellitus without complications: Secondary | ICD-10-CM

## 2023-01-07 DIAGNOSIS — Z7984 Long term (current) use of oral hypoglycemic drugs: Secondary | ICD-10-CM | POA: Diagnosis not present

## 2023-01-07 DIAGNOSIS — F419 Anxiety disorder, unspecified: Secondary | ICD-10-CM

## 2023-01-07 NOTE — Assessment & Plan Note (Signed)
Pt has not picked up xanax yet. We did have a long discussion with her and her niece, [name], about her goals and touched on what hospice is. She plans to have more open discussions with family members  - currently living in a rental house with niece and oldest son

## 2023-01-07 NOTE — Progress Notes (Signed)
Established patient visit   Patient: Morgan Garza   DOB: 03/07/1961   62 y.o. Female  MRN: [id number] Visit Date: 01/07/2023  Today's healthcare provider: Charlton Amor, DO   Chief Complaint  Patient presents with   Medical Management of Chronic Issues    DM f/u    SUBJECTIVE    Chief Complaint  Patient presents with   Medical Management of Chronic Issues    DM f/u   HPI HPI     Medical Management of Chronic Issues    Additional comments: DM f/u      Last edited by Roselyn Reef, CMA on 01/07/2023 11:04 AM.      Pt presents for follow up.   Recently diagnosed with stage 4 metastatic lung cancer found in the right calf soft tissue. Anxiety has been uncontrolled and patient said her anxiety is through the roof. She was given a few days of xanax but has not needed any. Saw her therapist.   T2DM - on rybelsus 7mg  and doing well  - on metformin 500mg   - A1c one month ago was 8.6   Review of Systems  Constitutional:  Negative for activity change, fatigue and fever.  Respiratory:  Negative for cough and shortness of breath.   Cardiovascular:  Negative for chest pain.  Gastrointestinal:  Negative for abdominal pain.  Genitourinary:  Negative for difficulty urinating.       Current Meds  Medication Sig   ALPRAZolam (XANAX) 0.25 MG tablet Take 1 tablet (0.25 mg total) by mouth 2 (two) times daily as needed for anxiety.   amLODipine (NORVASC) 10 MG tablet TAKE 1 TABLET BY MOUTH DAILY   atorvastatin (LIPITOR) 20 MG tablet Take 1 tablet (20 mg total) by mouth daily.   Blood Glucose Monitoring Suppl (ONE TOUCH ULTRA 2) w/Device KIT Dx DM E11.8. Check blood sugar 3 times daily.   Cholecalciferol (VITAMIN D3) 5000 units TABS Take 1,000 Units by mouth.   citalopram (CELEXA) 20 MG tablet Take 1 tablet (20 mg total) by mouth daily.   diclofenac Sodium (VOLTAREN) 1 % GEL Apply 2 g topically 4 (four) times daily. Apply 2g topically to knee up to four times daily    gabapentin (NEURONTIN) 300 MG capsule One tab PO qHS for a week, then BID for a week, then TID. May double weekly to a max of 3,600mg /day   glipiZIDE (GLUCOTROL XL) 10 MG 24 hr tablet Take 1 tablet (10 mg total) by mouth daily with breakfast.   glucose blood (ONETOUCH ULTRA) test strip Dx DM E11.8. Check blood sugar 3 times daily.   glucose blood test strip Use one strip to test blood sugar.   Lancets 30G MISC Dx DM E11.8. Check blood sugar 3 times daily.   lisinopril-hydrochlorothiazide (ZESTORETIC) 20-25 MG tablet Take 1 tablet by mouth daily.   metFORMIN (GLUCOPHAGE-XR) 500 MG 24 hr tablet TAKE TWO TABLETS BY MOUTH EVERY MORNING WITH BREAKFAST   Semaglutide (RYBELSUS) 7 MG TABS Take 1 tablet (7 mg total) by mouth daily.   traZODone (DESYREL) 50 MG tablet Take 0.5-1 tablets (25-50 mg total) by mouth at bedtime as needed for sleep.    OBJECTIVE    BP 121/71 (BP Location: Left Arm, Patient Position: Sitting, Cuff Size: Large)   Pulse (!) 109   Ht 5\' 8"  (1.727 m)   Wt 250 lb 12 oz (113.7 kg)   LMP 11/09/2010   SpO2 100%   BMI 38.13 kg/m   Physical Exam Vitals  reviewed.  Constitutional:      Appearance: She is well-developed.  HENT:     Head: Normocephalic and atraumatic.  Eyes:     Conjunctiva/sclera: Conjunctivae normal.  Cardiovascular:     Rate and Rhythm: Normal rate.  Pulmonary:     Effort: Pulmonary effort is normal.  Skin:    General: Skin is dry.     Coloration: Skin is not pale.  Neurological:     Mental Status: She is alert and oriented to person, place, and time.  Psychiatric:        Behavior: Behavior normal.        ASSESSMENT & PLAN    Problem List Items Addressed This Visit       Endocrine   Type 2 diabetes mellitus without complication, without long-term current use of insulin (HCC) - Primary    Pt doing well on rybelsus. We will keep her here. With recent diagnosis I do not need any more weight loss        Other   Anxiousness    Pt has not  picked up xanax yet. We did have a long discussion with her and her niece, Grier, about her goals and touched on what hospice is. She plans to have more open discussions with family members  - currently living in a rental house with niece and oldest son       No follow-ups on file.      No orders of the defined types were placed in this encounter.   No orders of the defined types were placed in this encounter.    Charlton Amor, DO  [facility] at [facility] [phone] (phone) [phone] (fax)  [facility]

## 2023-01-07 NOTE — Assessment & Plan Note (Signed)
Pt doing well on rybelsus. We will keep her here. With recent diagnosis I do not need any more weight loss

## 2023-01-12 ENCOUNTER — Ambulatory Visit (INDEPENDENT_AMBULATORY_CARE_PROVIDER_SITE_OTHER): Payer: Medicaid Other | Admitting: Licensed Clinical Social Worker

## 2023-01-12 DIAGNOSIS — F411 Generalized anxiety disorder: Secondary | ICD-10-CM

## 2023-01-12 NOTE — Progress Notes (Unsigned)
THERAPIST PROGRESS NOTE  Session Time: 10:06 am-10:50 am  Type of Therapy: Individual Therapy  Purpose of session/Treatment Goals addressed: Hailynn will manage anxiety as evidenced by processing the past and grieving loss, cope with current health issues, cope with anxious thoughts, and cope with life circumstances and be more independent for 5 out of 7 days for 60 days. [name] will increase coping skills to manage depression and improve ability to perform daily activities (LTG)   Interventions: Therapist utilized CBT, ACT, and Solution focused brief therapy to address mood and anxiety. Therapist provided support and empathy to patient during session. Therapist administered the PGQ9 and the GAD7.  Therapist processed patient's feelings about health. Therapist worked with patient on cognitive distortions and how they impact her anxious thoughts.   Effectiveness: Patient was oriented x4 (person, place, situation, and time) . Patient was  Anxious, Appropriate, and Depressed. Patient was Well Groomed. Patient completed the PHQ9 with a score of 23 indicating severe depressive symptoms. Patient completed the GAD7 with a score of 21 indicating severe anxious symptoms. Patient is waiting for her biopsy to find out what is in her leg. She has made the decision that she doesn't want to do chemo or radiation if it is cancer. Patient understood cognitive distortions and is going to be paying attention to her thoughts to identify cognitive distortions.   Patient engaged in session. Patient responded well to interventions. Patient continues to meet criteria for Generalized anxiety disorder  Patient will continue in outpatient therapy due to being the least restrictive service to meet her needs. Patient made minimal progress on her goals at this time.        01/12/2023   10:12 AM 01/07/2023   11:06 AM 01/04/2023   10:20 AM  Depression screen PHQ 2/9  Decreased Interest 3 3 3   Down, Depressed,  Hopeless 3 0 3  PHQ - 2 Score 6 3 6   Altered sleeping 3 3 3   Tired, decreased energy 3 3 3   Change in appetite 3 3 3   Feeling bad or failure about yourself  2 0 0  Trouble concentrating 3 3 3   Moving slowly or fidgety/restless 3 3 3   Suicidal thoughts 0 0 0  PHQ-9 Score 23 18 21   Difficult doing work/chores  Extremely dIfficult        01/12/2023   10:12 AM 01/07/2023   11:07 AM 01/04/2023   10:21 AM 01/01/2023   10:08 AM  GAD 7 : Generalized Anxiety Score  Nervous, Anxious, on Edge 3 3 3 3   Control/stop worrying 3 3 3 3   Worry too much - different things 3 3 3 3   Trouble relaxing 3 3 3 3   Restless 3 3 3 3   Easily annoyed or irritable 3 3 3 3   Afraid - awful might happen 3 3 3 3   Total GAD 7 Score 21 21 21 21   Anxiety Difficulty Very difficult Extremely difficult Very difficult Very difficult      Suicidal/Homicidal:  Negative without intent/plan   Recent stressful life event  Protective Factors: positive social support, responsibility to others (children, family), and hope for the future  Plan: Return in 2-4 weeks.  Diagnosis: Axis I: Generalized anxiety disorder    Collaboration of Care: Other PCP to consult about patient's progress and needs.   Patient/Guardian was advised Release of Information must be obtained prior to any record release in order to collaborate their care with an outside provider. Patient/Guardian was advised if they have not already  done so to contact the registration department to sign all necessary forms in order for [place] to release information regarding their care.   Consent: Patient/Guardian gives verbal consent for treatment and assignment of benefits for services provided during this visit. Patient/Guardian expressed understanding and agreed to proceed.

## 2023-01-18 ENCOUNTER — Ambulatory Visit (HOSPITAL_COMMUNITY): Payer: Medicaid Other | Admitting: Licensed Clinical Social Worker

## 2023-01-20 ENCOUNTER — Ambulatory Visit: Payer: Medicaid Other | Admitting: Neurology

## 2023-01-21 ENCOUNTER — Other Ambulatory Visit: Payer: Self-pay | Admitting: Family Medicine

## 2023-01-27 ENCOUNTER — Telehealth: Payer: Self-pay

## 2023-01-27 ENCOUNTER — Telehealth: Payer: Self-pay | Admitting: Neurology

## 2023-01-27 NOTE — Telephone Encounter (Signed)
Call to [facility], spoke with tabitha. Patietn has not had MRI/LP completed yet. They will reach out to schedule and call back with confirmation of appt.  Neoplastic panel is being looked into by Sharene Skeans, [employer].

## 2023-01-27 NOTE — Telephone Encounter (Signed)
[facility] [place]: [id number] exp. 01/27/23-03/27/23 sent to GI [phone]

## 2023-02-10 ENCOUNTER — Other Ambulatory Visit: Payer: Self-pay | Admitting: Family Medicine

## 2023-02-10 ENCOUNTER — Encounter: Payer: Self-pay | Admitting: Neurology

## 2023-02-10 DIAGNOSIS — Z1231 Encounter for screening mammogram for malignant neoplasm of breast: Secondary | ICD-10-CM

## 2023-02-11 ENCOUNTER — Other Ambulatory Visit: Payer: Self-pay | Admitting: Neurology

## 2023-02-11 MED ORDER — ALPRAZOLAM 1 MG PO TABS: 1.0000 mg | ORAL_TABLET | ORAL | 0 refills | Status: DC | PRN

## 2023-02-11 NOTE — Telephone Encounter (Signed)
I sent her Xanax for her upcoming MRI

## 2023-02-12 ENCOUNTER — Other Ambulatory Visit: Payer: Medicaid Other

## 2023-02-12 ENCOUNTER — Ambulatory Visit
Admission: RE | Admit: 2023-02-12 | Discharge: 2023-02-12 | Disposition: A | Discharge status: Home / Self Care | Payer: Medicaid Other | Source: Ambulatory Visit | Attending: Neurology | Admitting: Neurology

## 2023-02-12 DIAGNOSIS — R4189 Other symptoms and signs involving cognitive functions and awareness: Secondary | ICD-10-CM | POA: Diagnosis not present

## 2023-02-12 DIAGNOSIS — R413 Other amnesia: Secondary | ICD-10-CM

## 2023-02-12 DIAGNOSIS — C3490 Malignant neoplasm of unspecified part of unspecified bronchus or lung: Secondary | ICD-10-CM | POA: Diagnosis not present

## 2023-02-12 MED ORDER — GADOPICLENOL 0.5 MMOL/ML IV SOLN
10.0000 mL | Freq: Once | INTRAVENOUS | Status: AC | PRN
  Administered 2023-02-12: 10 mL via INTRAVENOUS

## 2023-02-15 ENCOUNTER — Encounter: Payer: Self-pay | Admitting: Gastroenterology

## 2023-02-17 ENCOUNTER — Encounter: Payer: Self-pay | Admitting: Neurology

## 2023-02-22 ENCOUNTER — Other Ambulatory Visit: Payer: Self-pay | Admitting: Family Medicine

## 2023-02-22 DIAGNOSIS — F5101 Primary insomnia: Secondary | ICD-10-CM

## 2023-02-22 DIAGNOSIS — E119 Type 2 diabetes mellitus without complications: Secondary | ICD-10-CM

## 2023-02-23 ENCOUNTER — Telehealth: Payer: Self-pay

## 2023-02-23 DIAGNOSIS — E119 Type 2 diabetes mellitus without complications: Secondary | ICD-10-CM

## 2023-02-23 MED ORDER — ACCU-CHEK AVIVA PLUS W/DEVICE KIT: PACK | 99 refills | Status: AC

## 2023-02-23 MED ORDER — ACCU-CHEK AVIVA PLUS VI STRP: ORAL_STRIP | 12 refills | Status: AC

## 2023-02-23 MED ORDER — LANCETS MISC. MISC
1.0000 | Freq: Every day | 99 refills | Status: AC | PRN
Start: 2023-02-23 — End: 2023-03-25

## 2023-02-23 MED ORDER — LANCET DEVICE MISC
1.0000 | Freq: Every day | 0 refills | Status: AC | PRN
Start: 2023-02-23 — End: 2023-03-25

## 2023-02-23 MED ORDER — LANCETS 30G MISC
99 refills | Status: AC
Start: 2023-02-23 — End: ?

## 2023-02-23 MED ORDER — BLOOD GLUCOSE TEST VI STRP
1.0000 | ORAL_STRIP | Freq: Every day | 99 refills | Status: AC | PRN
Start: 2023-02-23 — End: 2023-03-25

## 2023-02-23 MED ORDER — BLOOD GLUCOSE MONITORING SUPPL DEVI
1.0000 | Freq: Three times a day (TID) | 0 refills | Status: AC
Start: 2023-02-23 — End: ?

## 2023-02-23 NOTE — Telephone Encounter (Signed)
Copied from CRM [id number]. Topic: Clinical - Medication Refill >> Feb 23, 2023 12:34 PM Tiffany H wrote: Most Recent Primary Care Visit:  Provider: Charlton Amor  Department: [facility]  Visit Type: OFFICE VISIT  Date: 01/07/2023  Medication: Gwendolyn Lima with [facility] called to update order for Diabetic Testing Supplies for patient.   Insurance will only pay for the Accucheck brand. Please re-write prescription/order to reflect Accueck.   Glucose blood (Accucheck) test strip Blood Glucose Monitoring Suppl (Accucheck) w/Device KIT Lancets 30G MISC  Phone: [phone]  Has the patient contacted their pharmacy? No. The pharmacy reached out to [place] to request an updated prescription. Please assist.  (Agent: If no, request that the patient contact the pharmacy for the refill. If patient does not wish to contact the pharmacy document the reason why and proceed with request.) (Agent: If yes, when and what did the pharmacy advise?)  Is this the correct pharmacy for this prescription? Yes If no, delete pharmacy and type the correct one.  This is the patient's preferred pharmacy:  [facility] [id number] - [place], [place] - [address] [place] [address] Phone: [phone] Fax: [phone]   Has the prescription been filled recently? Yes  Is the patient out of the medication? Yes  Has the patient been seen for an appointment in the last year OR does the patient have an upcoming appointment? Yes  Can we respond through MyChart? No  Agent: Please be advised that Rx refills may take up to 3 business days. We ask that you follow-up with your pharmacy.  Not our pt. KH

## 2023-02-23 NOTE — Addendum Note (Signed)
Addended by: Nani Gasser D on: 02/23/2023 05:57 PM   Modules accepted: Orders

## 2023-02-23 NOTE — Telephone Encounter (Signed)
Copied from CRM [id number]. Topic: Clinical - Medication Refill >> Feb 23, 2023  3:03 PM Thomes Dinning wrote: Most Recent Primary Care Visit:  Provider: Charlton Amor  Department: [facility]  Visit Type: OFFICE VISIT  Date: 01/07/2023  Medication: Accu Check glucose monitor (Script needs to be for test strips, monitor and lancets)  Has the patient contacted their pharmacy? Yes (Agent: If no, request that the patient contact the pharmacy for the refill. If patient does not wish to contact the pharmacy document the reason why and proceed with request.) (Agent: If yes, when and what did the pharmacy advise?)  Is this the correct pharmacy for this prescription? Yes If no, delete pharmacy and type the correct one.  This is the patient's preferred pharmacy:  [facility] [id number] - [place], [place] - [address] [place] [address] Phone: [phone] Fax: [phone]   Has the prescription been filled recently? No  Is the patient out of the medication? Yes  Has the patient been seen for an appointment in the last year OR does the patient have an upcoming appointment? Yes  Can we respond through MyChart? Yes  Agent: Please be advised that Rx refills may take up to 3 business days. We ask that you follow-up with your pharmacy.

## 2023-02-23 NOTE — Telephone Encounter (Signed)
Accu chek sent to pharmacy.

## 2023-02-23 NOTE — Telephone Encounter (Signed)
Looks like it was changed to Accu-Chek today and sent.

## 2023-03-01 ENCOUNTER — Ambulatory Visit
Admission: EM | Admit: 2023-03-01 | Discharge: 2023-03-01 | Disposition: A | Discharge status: Home / Self Care | Payer: Medicaid Other | Attending: Family Medicine | Admitting: Family Medicine

## 2023-03-01 ENCOUNTER — Ambulatory Visit: Payer: Medicaid Other

## 2023-03-01 ENCOUNTER — Other Ambulatory Visit: Payer: Self-pay

## 2023-03-01 DIAGNOSIS — R051 Acute cough: Secondary | ICD-10-CM

## 2023-03-01 DIAGNOSIS — R059 Cough, unspecified: Secondary | ICD-10-CM | POA: Diagnosis not present

## 2023-03-01 DIAGNOSIS — C3490 Malignant neoplasm of unspecified part of unspecified bronchus or lung: Secondary | ICD-10-CM

## 2023-03-01 DIAGNOSIS — C3492 Malignant neoplasm of unspecified part of left bronchus or lung: Secondary | ICD-10-CM | POA: Diagnosis not present

## 2023-03-01 HISTORY — DX: Malignant neoplasm of unspecified part of unspecified bronchus or lung: C34.90

## 2023-03-01 MED ORDER — HYDROCOD POLI-CHLORPHE POLI ER 10-8 MG/5ML PO SUER: 5.0000 mL | Freq: Two times a day (BID) | ORAL | 0 refills | Status: DC | PRN

## 2023-03-01 MED ORDER — DOXYCYCLINE HYCLATE 100 MG PO CAPS: 100.0000 mg | ORAL_CAPSULE | Freq: Two times a day (BID) | ORAL | 0 refills | Status: DC

## 2023-03-01 NOTE — ED Triage Notes (Addendum)
Has had congestion, coughing, feeling hot and cold, sob, chest wall discomfort, ill feeling x 8 days. Has not checked temperature at home. Has completed radiation and chemo, due for scans again on 12/10. Had adenocarcinoma stage III, is in remission. Has metastasis to right calf, has appointment with Dr. Karie Schwalbe on Wednesday.

## 2023-03-01 NOTE — Discharge Instructions (Addendum)
Take doxycycline 2 times a day.  It is important to take this antibiotic with food Drink lots of fluids Get lots of rest May take over-the-counter cough and cold medicines as needed Take Tussionex at bedtime Call your doctor if not better by next week We will call you with your x-ray report later today

## 2023-03-01 NOTE — ED Provider Notes (Signed)
[facility]    CSN: [id number] Arrival date & time: 03/01/23  1123      History   Chief Complaint Chief Complaint  Patient presents with   Cough    HPI Morgan Garza is a 62 y.o. female.   HPI  Patient is known to me from prior visits.  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  She states that her cancer treatments are stable.  She is here because she has had a respiratory infection for about a week.  Cough.  Congestion.  Tiredness.  Some chills but no known fever.  Has not done any COVID or flu testing.  Symptoms been going on for 8 days.  Past Medical History:  Diagnosis Date   Anemia    Arthritis    Diabetes mellitus without complication (HCC)    History of colon polyps    Hyperlipidemia    Hypertension    Lung cancer (HCC)    Obesity     Patient Active Problem List   Diagnosis Date Noted   Primary insomnia 12/10/2022   Stage III adenocarcinoma of lung (HCC) 09/24/2022   Memory deficit 09/09/2022   Primary osteoarthritis of right knee 06/09/2022   Encounter for smoking cessation counseling 06/09/2022   Anxiousness 06/09/2022   Physical exam 04/27/2022   Cervicalgia 04/06/2018   Closed head injury with concussion 04/06/2018   Fall (on) (from) other stairs and steps, initial encounter 04/06/2018   Cigarette nicotine dependence without complication 02/06/2018   Sebaceous cyst of labia 02/06/2018   Mass of axilla, left 02/04/2018   Postmenopausal 02/02/2018   History of colon polyps    Allergic rhinitis 09/04/2015   BMI 39.0-39.9,adult 09/04/2015   Primary hypertension 09/04/2015   Hyperlipidemia 09/04/2015   Type 2 diabetes mellitus without complication, without long-term current use of insulin (HCC) 04/26/2012   Right lumbar radiculopathy 04/22/2012    Past Surgical History:  Procedure Laterality Date   COLONOSCOPY  07/07/2012   [facility]   NO PAST SURGERIES      OB History      Gravida  6   Para  4   Term      Preterm      AB  2   Living  4      SAB  1   IAB  1   Ectopic      Multiple      Live Births               Home Medications    Prior to Admission medications   Medication Sig Start Date End Date Taking? Authorizing Provider  doxycycline (VIBRAMYCIN) 100 MG capsule Take 1 capsule (100 mg total) by mouth 2 (two) times daily. 03/01/23  Yes Eustace Moore, MD  amLODipine (NORVASC) 10 MG tablet TAKE 1 TABLET BY MOUTH DAILY 11/09/22   Monica Becton, MD  atorvastatin (LIPITOR) 20 MG tablet Take 1 tablet (20 mg total) by mouth daily. 06/09/22   Charlton Amor, DO  Blood Glucose Monitoring Suppl (ACCU-CHEK AVIVA PLUS) w/Device KIT Check fasting glucose every morning and 2 hours after largest meal. 02/23/23   Tamera Punt, Erika S, DO  Blood Glucose Monitoring Suppl DEVI 1 each by Does not apply route in the morning, at noon, and at bedtime. May substitute to any manufacturer covered by patient's insurance.  Patient's insurance prefers Accu-Chek brand 02/23/23   Agapito Games, MD  chlorpheniramine-HYDROcodone (TUSSIONEX) 10-8 MG/5ML Take  5 mLs by mouth every 12 (twelve) hours as needed for cough. 03/01/23   Eustace Moore, MD  Cholecalciferol (VITAMIN D3) 5000 units TABS Take 1,000 Units by mouth.    [provider]  citalopram (CELEXA) 20 MG tablet Take 1 tablet (20 mg total) by mouth daily. 12/09/22   Windell Norfolk, MD  diclofenac Sodium (VOLTAREN) 1 % GEL APPLY 2 GRAMS  TO KNEE FOUR TIMES A DAY 01/21/23   Morey Hummingbird S, DO  gabapentin (NEURONTIN) 300 MG capsule One tab PO qHS for a week, then BID for a week, then TID. May double weekly to a max of 3,600mg /day 09/24/22   Monica Becton, MD  glipiZIDE (GLUCOTROL XL) 10 MG 24 hr tablet Take 1 tablet (10 mg total) by mouth daily with breakfast. 06/09/22   Charlton Amor, DO  glucose blood (ACCU-CHEK AVIVA PLUS) test strip Check fasting glucose every morning and 2 hours  after largest meal. 02/23/23   Tamera Punt, Erika S, DO  Glucose Blood (BLOOD GLUCOSE TEST STRIPS) STRP 1 each by In Vitro route daily as needed. May substitute to any manufacturer covered by patient's insurance. 02/23/23 03/25/23  Agapito Games, MD  glucose blood test strip Use one strip to test blood sugar. 09/03/16   [provider]  Lancet Device MISC 1 each by Does not apply route daily as needed. May substitute to any manufacturer covered by patient's insurance. 02/23/23 03/25/23  Agapito Games, MD  Lancets 30G MISC Check fasting glucose every morning and 2 hours after largest meal. 02/23/23   Charlton Amor, DO  Lancets Misc. MISC 1 each by Does not apply route daily as needed. May substitute to any manufacturer covered by patient's insurance. 02/23/23 03/25/23  Agapito Games, MD  lisinopril-hydrochlorothiazide (ZESTORETIC) 20-25 MG tablet Take 1 tablet by mouth daily. 06/09/22   Charlton Amor, DO  metFORMIN (GLUCOPHAGE-XR) 500 MG 24 hr tablet TAKE TWO TABLETS BY MOUTH EVERY MORNING WITH BREAKFAST 06/09/22   Charlton Amor, DO  Semaglutide (RYBELSUS) 7 MG TABS Take 1 tablet (7 mg total) by mouth daily. 12/10/22   Charlton Amor, DO  traZODone (DESYREL) 50 MG tablet TAKE 1/2 TO 1 TABLET BY MOUTH EVERY NIGHT AT BEDTIME AS NEEDED FOR SLEEP 02/23/23   Charlton Amor, DO    Family History Family History  Problem Relation Age of Onset   Hypertension Mother    COPD Mother    Cancer Father    Gastric cancer Father    Diabetes Daughter    Testicular cancer Son    Diabetes Son    Esophageal cancer Neg Hx    Colon cancer Neg Hx     Social History Social History   Tobacco Use   Smoking status: Former    Current packs/day: 0.50    Average packs/day: 0.5 packs/day for 1 year (0.5 ttl pk-yrs)    Types: Cigarettes   Smokeless tobacco: Never  Vaping Use   Vaping status: Never Used  Substance Use Topics   Alcohol use: Yes    Alcohol/week: 1.0 standard drink of  alcohol    Types: 1 Standard drinks or equivalent per week    Comment: ocassionally   Drug use: Never     Allergies   Bupropion   Review of Systems Review of Systems  See HPI Physical Exam Triage Vital Signs ED Triage Vitals  Encounter Vitals Group     BP 03/01/23 1126 (!) 185/91     Systolic  BP Percentile --      Diastolic BP Percentile --      Pulse Rate 03/01/23 1126 73     Resp 03/01/23 1126 18     Temp 03/01/23 1126 98.2 F (36.8 C)     Temp Source 03/01/23 1126 Oral     SpO2 03/01/23 1126 100 %     Weight --      Height --      Head Circumference --      Peak Flow --      Pain Score 03/01/23 1132 6     Pain Loc --      Pain Education --      Exclude from Growth Chart --    No data found.  Updated Vital Signs BP (!) 179/95 (BP Location: Left Arm)   Pulse 73   Temp 98.2 F (36.8 C) (Oral)   Resp 18   LMP 11/09/2010   SpO2 100%      Physical Exam Constitutional:      General: She is not in acute distress.    Appearance: She is well-developed. She is obese. She is ill-appearing.  HENT:     Head: Normocephalic and atraumatic.  Eyes:     Conjunctiva/sclera: Conjunctivae normal.     Pupils: Pupils are equal, round, and reactive to light.  Cardiovascular:     Rate and Rhythm: Normal rate.  Pulmonary:     Effort: Pulmonary effort is normal. No respiratory distress.     Comments: Decreased breath sound left upper.  Few central rhonchi Abdominal:     General: There is no distension.     Palpations: Abdomen is soft.  Musculoskeletal:        General: Normal range of motion.     Cervical back: Normal range of motion.  Skin:    General: Skin is warm and dry.  Neurological:     Mental Status: She is alert.      UC Treatments / Results  Labs (all labs ordered are listed, but only abnormal results are displayed) Labs Reviewed - No data to display  EKG   Radiology DG Chest 2 View  Result Date: 03/01/2023 CLINICAL DATA:  Lung cancer treatment,  cough EXAM: CHEST - 2 VIEW COMPARISON:  05/07/2022 FINDINGS: The heart size and mediastinal contours are within normal limits. Somewhat bandlike opacity of the suprahilar left lung and left apex, new compared to prior examination. The visualized skeletal structures are unremarkable. IMPRESSION: Somewhat bandlike opacity of the suprahilar left lung and left apex, new compared to prior examination. This may reflect known lung malignancy and associated treatment sequelae however acute airspace disease difficult to exclude. Consider CT for further evaluation. Electronically Signed   By: Jearld Lesch M.D.   On: 03/01/2023 13:54    Procedures Procedures (including critical care time)  Medications Ordered in UC Medications - No data to display  Initial Impression / Assessment and Plan / UC Course  I have reviewed the triage vital signs and the nursing notes.  Pertinent labs & imaging results that were available during my care of the patient were reviewed by me and considered in my medical decision making (see chart for details).     Patient is discharged prior to her x-ray being read.  X-ray is delayed.  Do not see any specific pneumonia although there is some cancer and radiation changes that I am unable to decipher.  Will call patient when x-ray reading is available Final Clinical Impressions(s) / UC  Diagnoses   Final diagnoses:  Acute cough  Adenocarcinoma of left lung, stage 3 (HCC)     Discharge Instructions      Take doxycycline 2 times a day.  It is important to take this antibiotic with food Drink lots of fluids Get lots of rest May take over-the-counter cough and cold medicines as needed Take Tussionex at bedtime Call your doctor if not better by next week We will call you with your x-ray report later today     ED Prescriptions     Medication Sig Dispense Auth. Provider   doxycycline (VIBRAMYCIN) 100 MG capsule Take 1 capsule (100 mg total) by mouth 2 (two) times daily. 20  capsule Eustace Moore, MD   chlorpheniramine-HYDROcodone (TUSSIONEX) 10-8 MG/5ML Take 5 mLs by mouth every 12 (twelve) hours as needed for cough. 115 mL Eustace Moore, MD      I have reviewed the PDMP during this encounter.   Eustace Moore, MD 03/01/23 1409

## 2023-03-03 ENCOUNTER — Ambulatory Visit: Payer: Medicaid Other | Admitting: Sports Medicine

## 2023-03-04 ENCOUNTER — Other Ambulatory Visit: Payer: Medicaid Other

## 2023-03-15 ENCOUNTER — Other Ambulatory Visit: Payer: Self-pay | Admitting: Sports Medicine

## 2023-03-15 ENCOUNTER — Telehealth: Payer: Medicaid Other | Admitting: Family Medicine

## 2023-03-15 ENCOUNTER — Encounter: Payer: Self-pay | Admitting: Family Medicine

## 2023-03-15 DIAGNOSIS — E119 Type 2 diabetes mellitus without complications: Secondary | ICD-10-CM

## 2023-03-15 DIAGNOSIS — F419 Anxiety disorder, unspecified: Secondary | ICD-10-CM

## 2023-03-15 DIAGNOSIS — R413 Other amnesia: Secondary | ICD-10-CM

## 2023-03-15 DIAGNOSIS — C3492 Malignant neoplasm of unspecified part of left bronchus or lung: Secondary | ICD-10-CM

## 2023-03-15 DIAGNOSIS — F321 Major depressive disorder, single episode, moderate: Secondary | ICD-10-CM

## 2023-03-15 DIAGNOSIS — Z7984 Long term (current) use of oral hypoglycemic drugs: Secondary | ICD-10-CM

## 2023-03-15 DIAGNOSIS — M5416 Radiculopathy, lumbar region: Secondary | ICD-10-CM

## 2023-03-15 MED ORDER — METFORMIN HCL ER 500 MG PO TB24: ORAL_TABLET | ORAL | 3 refills | Status: DC

## 2023-03-15 MED ORDER — ALPRAZOLAM 1 MG PO TABS: 1.0000 mg | ORAL_TABLET | ORAL | 0 refills | Status: AC | PRN

## 2023-03-15 MED ORDER — RYBELSUS 7 MG PO TABS: 7.0000 mg | ORAL_TABLET | Freq: Every day | ORAL | 1 refills | Status: DC

## 2023-03-15 MED ORDER — CITALOPRAM HYDROBROMIDE 20 MG PO TABS: 20.0000 mg | ORAL_TABLET | Freq: Every day | ORAL | 1 refills | Status: DC

## 2023-03-15 NOTE — Assessment & Plan Note (Signed)
Pt tearful during visit and is not doing well mentally and emotionally. We will go ahead and restart her celexa  - xanax given prn pmp reviewed and verified with no red flags

## 2023-03-15 NOTE — Assessment & Plan Note (Addendum)
Pt says she is having a significant amount of memory impairment she was seen by neuro who said her brain mri did not show any signs of mets however she has noticed an "abrupt and progressive decline" - she says that she cannot do her finances anymore and has tried to pay bills multiple times a day for bills she paid earlier that day. She cannot remember how to get home. She is scared. 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

## 2023-03-15 NOTE — Progress Notes (Signed)
Established patient visit   Patient: Morgan Garza   DOB: 1961/03/13   62 y.o. Female  MRN: [id number] Visit Date: 03/15/2023  Today's healthcare provider: Charlton Amor, DO   Chief Complaint  Patient presents with   Medical Management of Chronic Issues    Depression     SUBJECTIVE    Chief Complaint  Patient presents with   Medical Management of Chronic Issues    Depression    HPI HPI     Medical Management of Chronic Issues    Additional comments: Depression       Last edited by Roselyn Reef, CMA on 03/15/2023  1:45 PM.      I connected with  Morgan Garza on 03/15/23 by a video and audio enabled telemedicine application and verified that I am speaking with the correct person using two identifiers.  Patient Location: Home  Provider Location: Office/Clinic  I discussed the limitations of evaluation and management by telemedicine. The patient expressed understanding and agreed to proceed.  Pt tells me she has some new updates. 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 She currently has stage 4 metastatic lung disease with mets found in right calf soft tissue.   T2DM - on rybelsus and metformin - due for A1c check 04/08/22  Pt also requesting handicap parking pass   Would like to discuss anxiety and depression and sleep  - on celexa 20mg   - on trazodone 50mg  for sleep   Review of Systems  Constitutional:  Negative for activity change, fatigue and fever.  Respiratory:  Negative for cough and shortness of breath.   Cardiovascular:  Negative for chest pain.  Gastrointestinal:  Negative for abdominal pain.  Genitourinary:  Negative for difficulty urinating.  Psychiatric/Behavioral:  Positive for confusion and decreased concentration. The patient is nervous/anxious.        Current Meds  Medication Sig   amLODipine (NORVASC) 10 MG tablet TAKE 1 TABLET BY MOUTH DAILY   atorvastatin (LIPITOR) 20 MG  tablet Take 1 tablet (20 mg total) by mouth daily.   Blood Glucose Monitoring Suppl (ACCU-CHEK AVIVA PLUS) w/Device KIT Check fasting glucose every morning and 2 hours after largest meal.   Blood Glucose Monitoring Suppl DEVI 1 each by Does not apply route in the morning, at noon, and at bedtime. May substitute to any manufacturer covered by patient's insurance.  Patient's insurance prefers Accu-Chek brand   chlorpheniramine-HYDROcodone (TUSSIONEX) 10-8 MG/5ML Take 5 mLs by mouth every 12 (twelve) hours as needed for cough.   Cholecalciferol (VITAMIN D3) 5000 units TABS Take 1,000 Units by mouth.   diclofenac Sodium (VOLTAREN) 1 % GEL APPLY 2 GRAMS  TO KNEE FOUR TIMES A DAY   doxycycline (VIBRAMYCIN) 100 MG capsule Take 1 capsule (100 mg total) by mouth 2 (two) times daily.   gabapentin (NEURONTIN) 300 MG capsule One tab PO qHS for a week, then BID for a week, then TID. May double weekly to a max of 3,600mg /day   glucose blood (ACCU-CHEK AVIVA PLUS) test strip Check fasting glucose every morning and 2 hours after largest meal.   Glucose Blood (BLOOD GLUCOSE TEST STRIPS) STRP 1 each by In Vitro route daily as needed. May substitute to any manufacturer covered by patient's insurance.   glucose blood test strip Use one strip to test blood sugar.   Lancet Device MISC 1 each by Does not apply route daily as needed. May substitute to any  manufacturer covered by [employer].   Lancets 30G MISC Check fasting glucose every morning and 2 hours after largest meal.   Lancets Misc. MISC 1 each by Does not apply route daily as needed. May substitute to any manufacturer covered by patient's insurance.   lisinopril-hydrochlorothiazide (ZESTORETIC) 20-25 MG tablet Take 1 tablet by mouth daily.   traZODone (DESYREL) 50 MG tablet TAKE 1/2 TO 1 TABLET BY MOUTH EVERY NIGHT AT BEDTIME AS NEEDED FOR SLEEP   [DISCONTINUED] ALPRAZolam (XANAX) 1 MG tablet    [DISCONTINUED] citalopram (CELEXA) 20 MG tablet Take 1  tablet (20 mg total) by mouth daily.   [DISCONTINUED] glipiZIDE (GLUCOTROL XL) 10 MG 24 hr tablet Take 1 tablet (10 mg total) by mouth daily with breakfast.   [DISCONTINUED] metFORMIN (GLUCOPHAGE-XR) 500 MG 24 hr tablet TAKE TWO TABLETS BY MOUTH EVERY MORNING WITH BREAKFAST   [DISCONTINUED] Semaglutide (RYBELSUS) 7 MG TABS Take 1 tablet (7 mg total) by mouth daily.    OBJECTIVE    LMP 11/09/2010   Physical Exam Vitals reviewed.  Constitutional:      Appearance: She is well-developed.  HENT:     Head: Normocephalic and atraumatic.  Eyes:     Conjunctiva/sclera: Conjunctivae normal.  Cardiovascular:     Rate and Rhythm: Normal rate.  Pulmonary:     Effort: Pulmonary effort is normal.  Skin:    General: Skin is dry.     Coloration: Skin is not pale.  Neurological:     Mental Status: She is alert and oriented to person, place, and time.  Psychiatric:        Behavior: Behavior normal.        ASSESSMENT & PLAN    Problem List Items Addressed This Visit       Respiratory   Stage III adenocarcinoma of lung (HCC)   Relevant Medications   ALPRAZolam (XANAX) 1 MG tablet     Endocrine   Type 2 diabetes mellitus without complication, without long-term current use of insulin (HCC) - Primary   Pt needs a refill of metformin and rybelsus for dm management--will come into clinic to get A1c drawn.  - will also need BMP at that time       Relevant Medications   metFORMIN (GLUCOPHAGE-XR) 500 MG 24 hr tablet   Semaglutide (RYBELSUS) 7 MG TABS     Other   Anxiousness   Pt tearful during visit and is not doing well mentally and emotionally. We will go ahead and restart her celexa  - xanax given prn pmp reviewed and verified with no red flags       Relevant Medications   ALPRAZolam (XANAX) 1 MG tablet   citalopram (CELEXA) 20 MG tablet   Memory deficit   Pt says she is having a significant amount of memory impairment she was seen by neuro who said her brain mri did not show any  signs of mets however she has noticed an "abrupt and progressive decline" - she says that she cannot do her finances anymore and has tried to pay bills multiple times a day for bills she paid earlier that day. She cannot remember how to get home. She is scared. 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      Meds ordered this encounter  Medications   ALPRAZolam (XANAX) 1 MG tablet    Sig: Take 1 tablet (1 mg total) by mouth as needed for anxiety.    Dispense:  30 tablet    Refill:  0   citalopram (CELEXA) 20 MG tablet    Sig: Take 1 tablet (20 mg total) by mouth daily.    Dispense:  90 tablet    Refill:  1   metFORMIN (GLUCOPHAGE-XR) 500 MG 24 hr tablet    Sig: TAKE TWO TABLETS BY MOUTH EVERY MORNING WITH BREAKFAST    Dispense:  180 tablet    Refill:  3   Semaglutide (RYBELSUS) 7 MG TABS    Sig: Take 1 tablet (7 mg total) by mouth daily.    Dispense:  90 tablet    Refill:  1    Orders Placed This Encounter  Procedures   Ambulatory referral to Neurology    Referral Priority:   Routine    Referral Type:   Consultation    Referral Reason:   Specialty Services Required    Requested Specialty:   Neurology    Number of Visits Requested:   1     Charlton Amor, DO  [facility] at [facility] [phone] (phone) [phone] (fax)  [facility]

## 2023-03-15 NOTE — Assessment & Plan Note (Signed)
Pt needs a refill of metformin and rybelsus for dm management--will come into clinic to get A1c drawn.  - will also need BMP at that time

## 2023-03-17 NOTE — Progress Notes (Addendum)
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 Of note, the patient has been seen by Dr. Teresa Coombs at [facility] on 12/09/2022 with complaints of memory change, yielding a Moca of 19/30. MoCA today is again 19/30.  Etiology is unclear,  likely multifactorial, however, for definite diagnosis and in view of her recent blackout episodes, completion of workup recommended by Dr. Teresa Coombs is encouraged. Recommendations discussed with patient as follows:     Memory Concerns of unclear etiology  She would benefit from Neuropsychological testing to further evaluate cognitive concerns and determine other underlying cause of memory changes, other neurodegenerative diseases and other  potential contribution from sleep, anxiety, attention, or depression.  Agree with LP recommended by Dr. Teresa Coombs She may benefit from 24-hour EEG  Discussed continued follow up at [facility] for continuation of care  Follow with [facility] for mood disorder   Subjective:   The patient is accompanied by her son who supplements the history.   How long did patient have memory difficulties?  Since around June 2023.  Apparently, the patient while in [place] she feels that she was exposed to " toxins in an air B&B apartment",  briefly hospitalized while they are although her workup was negative.  Since then she was diagnosed with adenocarcinoma of the lung undergoing radiation and chemotherapy.  Reports some difficulty remembering new information, conversations and names.  She has to write everything down otherwise and she will forget.  Long-term memory is good.  She is easily distracted.  Recently she was trying to get gas and she forgot how to use the pump.  She was seen at [facility]  and to date, workup was negative. They wanted to perform LP but she declined at the time. She is concerned because "the memory is progressively worse".   repeats oneself?  Endorsed Disoriented when walking into a room?" A lot of the time"  for ex.  2 days ago she was running errands and "when getting to [employer] everything became a problem, could not understand what people were saying and disoriented,  I was so messed up"   Leaving objects in unusual places? Denies.   Wandering behavior?  Denies.  Any personality changes?  She has been increasingly anxious and tearful.  She has a history of GAD.  Any history of depression?:  Ongoing due to these issues," I was not taking my meds completely so my son is to take over". Hallucinations or paranoia? Endorsed, she has to look "when driving I see someone on the side of the road". " I don't trust anything I see". "I was seeing a foot walking past while I was doing my hair" Seizures?  Denies any history of seizures. Denies metallic or blood taste, muscle tightness of tongue biting.    Any sleep changes?  Does not sleep well, has insomnia, trazodone helps. Denies vivid dreams, REM behavior or sleepwalking   Sleep apnea?  Denies   Any hygiene concerns?  Denies. " I don't know why I plan to brush my teeth and then is the night and I forgot to do" Independent of bathing and dressing?  Endorsed  Does the patient needs help with medications? Son is in charge as she may be missing doses of her mood medications  Who is in charge of the finances? She admits to having pay bills multiple times so her son has been more active participant. " I used to be extraordinarily brilliant and I am so bothered by this".     Any changes in appetite?  Yes, I am eating more. I forget that I ate and do it again     Patient have trouble swallowing? Denies.   Does the patient cook? "Not much, but with someone in there because I have left stuff in the stove". Any history of headaches?    Due to HTN she has some headaches.  Chronic pain ?  She has right chronic sciatic pain.   Ambulates with difficulty?  Denies. "I have to walk with a purpose because I don't want to fall"  Recent falls or head injuries?  In January 2020 she sustained a fall from the stairs, with close head injury with concussion. Vision changes? There have been times when I had some" black out "for a few seconds while driving I was confused at the time. Last episode 6 weeks ago.  (MRI brain neg for acute findings) Unilateral weakness, numbness or tingling? 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   Denies.   Any anosmia?  Denies.   Any incontinence of urine? She has some accidental episodes  waking  up wet in the middle of the night.  Any bowel dysfunction? Some constipation  Patient lives with her son  History of heavy alcohol intake? Denies.   History of heavy tobacco use? Denies.   Family history of dementia? Denies.  Does patient drive? Yes, has been needing more directions on how to get home even in familiar area. I recently I did not know where to go to get home.   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  Per chart, the patient had extensive workup for RTD was negative, HIV negative, RPR negative, B12 587 and TSH within normal limits at 3.09.  Lyme disease workup not detected, negative. Sed rate 38, CRP 3, Autoimmune testing all negative Normal lipid panel A1c 6.9     30 min EEG negative for seizures, without evidence of interictal epileptiform discharges.  Past Medical History:  Diagnosis Date   Anemia    Arthritis    Diabetes mellitus without complication (HCC)    History of colon polyps    Hyperlipidemia    Hypertension    Lung  cancer (HCC)    Obesity      Past Surgical History:  Procedure Laterality Date   COLONOSCOPY  07/07/2012   [facility]   NO PAST SURGERIES       Allergies  Allergen Reactions   Bupropion Hives         Current Outpatient Medications  Medication Instructions   ALPRAZolam (XANAX) 1 mg, Oral, As needed   amLODipine (NORVASC) 10 mg, Oral, Daily   atorvastatin (LIPITOR) 20 mg, Oral, Daily   Blood Glucose Monitoring Suppl (ACCU-CHEK AVIVA PLUS) w/Device KIT Check fasting glucose every morning and 2 hours after largest meal.   Blood Glucose Monitoring Suppl DEVI 1 each, Does not apply, 3 times daily, May substitute to any manufacturer covered by patient's insurance.  Patient's insurance prefers Accu-Chek brand   citalopram (CELEXA)  20 mg, Oral, Daily   diclofenac Sodium (VOLTAREN) 1 % GEL APPLY 2 GRAMS  TO KNEE FOUR TIMES A DAY   gabapentin (NEURONTIN) 300 mg, Oral, 3 times daily   glucose blood (ACCU-CHEK AVIVA PLUS) test strip Check fasting glucose every morning and 2 hours after largest meal.   Glucose Blood (BLOOD GLUCOSE TEST STRIPS) STRP 1 each, In Vitro, Daily PRN, May substitute to any manufacturer covered by patient's insurance.   glucose blood test strip Use one strip to test blood sugar.   Lancet Device MISC 1 each, Does not apply, Daily PRN, May substitute to any manufacturer covered by patient's insurance.   Lancets 30G MISC Check fasting glucose every morning and 2 hours after largest meal.   Lancets Misc. MISC 1 each, Does not apply, Daily PRN, May substitute to any manufacturer covered by patient's insurance.   lisinopril-hydrochlorothiazide (ZESTORETIC) 20-25 MG tablet 1 tablet, Oral, Daily   metFORMIN (GLUCOPHAGE-XR) 500 MG 24 hr tablet TAKE TWO TABLETS BY MOUTH EVERY MORNING WITH BREAKFAST   Rybelsus 7 mg, Oral, Daily   traZODone (DESYREL) 50 MG tablet TAKE 1/2 TO 1 TABLET BY MOUTH EVERY NIGHT AT BEDTIME AS NEEDED FOR SLEEP   Vitamin D3 1,000 Units      VITALS:   Vitals:   03/18/23 0753  BP: (!) 148/82  Pulse: 77  SpO2: 99%  Weight: 262 lb (118.8 kg)  Height: 5\' 8"  (1.727 m)      PHYSICAL EXAM   HEENT:  Normocephalic, atraumatic. The superficial temporal arteries are without ropiness or tenderness. Cardiovascular: Regular rate and rhythm. Lungs: Clear to auscultation bilaterally. Neck: There are no carotid bruits noted bilaterally.  NEUROLOGICAL:    03/18/2023    9:00 AM 12/09/2022    3:28 PM  Montreal Cognitive Assessment   Visuospatial/ Executive (0/5) 2 3  Naming (0/3) 1 2  Attention: Read list of digits (0/2) 1 1  Attention: Read list of letters (0/1) 1 1  Attention: Serial 7 subtraction starting at 100 (0/3) 2 3  Language: Repeat phrase (0/2) 1 0  Language : Fluency (0/1) 1 1  Abstraction (0/2) 1 2  Delayed Recall (0/5) 3 2  Orientation (0/6) 6 4  Total 19 19  Adjusted Score (based on education) 19         No data to display           Orientation:  Alert and oriented to person, place and time. No aphasia or dysarthria. Fund of knowledge is appropriate. Recent memory and remote memory intact.  Attention and concentration are reduced.  Able to name objects 1/3 and repeat phrases. Delayed recall 3 /5 Cranial nerves: There is good facial symmetry. Extraocular muscles are intact and visual fields are full to confrontational testing. Speech is fluent and clear. No tongue deviation. Hearing is intact to conversational tone. Tone: Tone is good throughout. Sensation: Sensation is intact to light touch. Vibration is intact at the bilateral big toe.  Coordination: The patient has no difficulty with RAM's or FNF bilaterally. Normal finger to nose  Motor: Strength is 5/5 in the bilateral upper and lower extremities. There is no pronator drift. There are no fasciculations noted. DTR's: Deep tendon reflexes are 2/4 bilaterally. Gait and Station: The patient is able to ambulate without difficulty The patient is able to  heel toe walk . Gait is cautious and narrow. The patient is able to ambulate in a tandem fashion.       Thank you for allowing [place]  the opportunity to participate in the care of this nice patient. Please do not hesitate to contact us for any questions or concerns.   Total time spent on today's visit was 90 minutes dedicated to this patient today, preparing to see patient, examining the patient, ordering tests and/or medications and counseling the patient, documenting clinical information in the EHR or other health record, independently interpreting results and communicating results to the patient/family, discussing treatment and goals, answering patient's questions and coordinating care.  Cc:  Arlean Hopping [facility] 03/18/2023 9:49 AM

## 2023-03-18 ENCOUNTER — Ambulatory Visit: Payer: Medicaid Other | Admitting: Physician Assistant

## 2023-03-18 ENCOUNTER — Ambulatory Visit: Payer: Medicaid Other

## 2023-03-18 ENCOUNTER — Encounter: Payer: Self-pay | Admitting: Physician Assistant

## 2023-03-18 VITALS — BP 148/82 | HR 77 | Ht 68.0 in | Wt 262.0 lb

## 2023-03-18 DIAGNOSIS — R413 Other amnesia: Secondary | ICD-10-CM

## 2023-03-18 NOTE — Patient Instructions (Addendum)
It was a pleasure to see you today at our office.   Recommendations:  Recommend LP at [facility]  Recommend EEG 24 h at [facility]  Recommend Neuropsychological testing at outside facility given that the patient is younger than 63 years old to further evaluate cognitive concerns and determine other underlying cause of memory changes, including potential contribution from sleep, anxiety, attention, or depression  Follow up with Behavioral Health  Follow up at [facility]   Monitor driving   For psychiatric meds, mood meds: Please have your primary care physician manage these medications.  If you have any severe symptoms of a stroke, or other severe issues such as confusion,severe chills or fever, etc call 911 or go to the ER as you may need to be evaluated further     For assessment of decision of mental capacity and competency:  Call Dr. Erick Blinks, geriatric psychiatrist at [phone]  Counseling regarding caregiver distress, including caregiver depression, anxiety and issues regarding community resources, adult day care programs, adult living facilities, or memory care questions:  please contact your  Primary Doctor's Social Worker   Whom to call: Memory  decline, memory medications: Call our office [phone]    [email or web address]   RECOMMENDATIONS FOR ALL PATIENTS WITH MEMORY PROBLEMS: 1. Continue to exercise (Recommend 30 minutes of walking everyday, or 3 hours every week) 2. Increase social interactions - continue going to [place] and enjoy social gatherings with friends and family 3. Eat healthy, avoid fried foods and eat more fruits and vegetables 4. Maintain adequate blood pressure, blood sugar, and blood cholesterol level. Reducing the risk of stroke and cardiovascular disease also helps promoting better memory. 5. Avoid stressful situations. Live a simple life and avoid aggravations. Organize your time and prepare for the next day in  anticipation. 6. Sleep well, avoid any interruptions of sleep and avoid any distractions in the bedroom that may interfere with adequate sleep quality 7. Avoid sugar, avoid sweets as there is a strong link between excessive sugar intake, diabetes, and cognitive impairment We discussed the Mediterranean diet, which has been shown to help patients reduce the risk of progressive memory disorders and reduces cardiovascular risk. This includes eating fish, eat fruits and green leafy vegetables, nuts like almonds and hazelnuts, walnuts, and also use olive oil. Avoid fast foods and fried foods as much as possible. Avoid sweets and sugar as sugar use has been linked to worsening of memory function.  There is always a concern of gradual progression of memory problems. If this is the case, then we may need to adjust level of care according to patient needs. Support, both to the patient and caregiver, should then be put into place.        DRIVING: Regarding driving, in patients with progressive memory problems, driving will be impaired. We advise to have someone else do the driving if trouble finding directions or if minor accidents are reported. Independent driving assessment is available to determine safety of driving.   If you are interested in the driving assessment, you can contact the following:  [employer] in [place] [phone]  [facility] [phone]  [facility] [phone]  [facility] [phone] or [phone]   FALL PRECAUTIONS: Be cautious when walking. Scan the area for obstacles that may increase the risk of trips and falls. When getting up in the mornings, sit up at the edge of the bed for a few minutes before getting out of bed. Consider elevating the bed at the head  end to avoid drop of blood pressure when getting up. Walk always in a well-lit room (use night lights in the walls). Avoid area rugs or power cords from appliances  in the middle of the walkways. Use a walker or a cane if necessary and consider physical therapy for balance exercise. Get your eyesight checked regularly.  FINANCIAL OVERSIGHT: Supervision, especially oversight when making financial decisions or transactions is also recommended.  HOME SAFETY: Consider the safety of the kitchen when operating appliances like stoves, microwave oven, and blender. Consider having supervision and share cooking responsibilities until no longer able to participate in those. Accidents with firearms and other hazards in the house should be identified and addressed as well.   ABILITY TO BE LEFT ALONE: If patient is unable to contact 911 operator, consider using LifeLine, or when the need is there, arrange for someone to stay with patients. Smoking is a fire hazard, consider supervision or cessation. Risk of wandering should be assessed by caregiver and if detected at any point, supervision and safe proof recommendations should be instituted.  MEDICATION SUPERVISION: Inability to self-administer medication needs to be constantly addressed. Implement a mechanism to ensure safe administration of the medications.      Mediterranean Diet A Mediterranean diet refers to food and lifestyle choices that are based on the traditions of countries located on the [employer]. This way of eating has been shown to help prevent certain conditions and improve outcomes for people who have chronic diseases, like kidney disease and heart disease. What are tips for following this plan? Lifestyle  Cook and eat meals together with your family, when possible. Drink enough fluid to keep your urine clear or pale yellow. Be physically active every day. This includes: Aerobic exercise like running or swimming. Leisure activities like gardening, walking, or housework. Get 7-8 hours of sleep each night. If recommended by your health care provider, drink red wine in moderation. This means 1  glass a day for nonpregnant women and 2 glasses a day for men. A glass of wine equals 5 oz (150 mL). Reading food labels  Check the serving size of packaged foods. For foods such as rice and pasta, the serving size refers to the amount of cooked product, not dry. Check the total fat in packaged foods. Avoid foods that have saturated fat or trans fats. Check the ingredients list for added sugars, such as corn syrup. Shopping  At the grocery store, buy most of your food from the areas near the walls of the store. This includes: Fresh fruits and vegetables (produce). Grains, beans, nuts, and seeds. Some of these may be available in unpackaged forms or large amounts (in bulk). Fresh seafood. Poultry and eggs. Low-fat dairy products. Buy whole ingredients instead of prepackaged foods. Buy fresh fruits and vegetables in-season from local farmers markets. Buy frozen fruits and vegetables in resealable bags. If you do not have access to quality fresh seafood, buy precooked frozen shrimp or canned fish, such as tuna, salmon, or sardines. Buy small amounts of raw or cooked vegetables, salads, or olives from the deli or salad bar at your store. Stock your pantry so you always have certain foods on hand, such as olive oil, canned tuna, canned tomatoes, rice, pasta, and beans. Cooking  Cook foods with extra-virgin olive oil instead of using butter or other vegetable oils. Have meat as a side dish, and have vegetables or grains as your main dish. This means having meat in small portions or adding small amounts  of meat to foods like pasta or stew. Use beans or vegetables instead of meat in common dishes like chili or lasagna. Experiment with different cooking methods. Try roasting or broiling vegetables instead of steaming or sauteing them. Add frozen vegetables to soups, stews, pasta, or rice. Add nuts or seeds for added healthy fat at each meal. You can add these to yogurt, salads, or vegetable  dishes. Marinate fish or vegetables using olive oil, lemon juice, garlic, and fresh herbs. Meal planning  Plan to eat 1 vegetarian meal one day each week. Try to work up to 2 vegetarian meals, if possible. Eat seafood 2 or more times a week. Have healthy snacks readily available, such as: Vegetable sticks with hummus. Greek yogurt. Fruit and nut trail mix. Eat balanced meals throughout the week. This includes: Fruit: 2-3 servings a day Vegetables: 4-5 servings a day Low-fat dairy: 2 servings a day Fish, poultry, or lean meat: 1 serving a day Beans and legumes: 2 or more servings a week Nuts and seeds: 1-2 servings a day Whole grains: 6-8 servings a day Extra-virgin olive oil: 3-4 servings a day Limit red meat and sweets to only a few servings a month What are my food choices? Mediterranean diet Recommended Grains: Whole-grain pasta. Brown rice. Bulgar wheat. Polenta. Couscous. Whole-wheat bread. Orpah Cobb. Vegetables: Artichokes. Beets. Broccoli. Cabbage. Carrots. Eggplant. Green beans. Chard. Kale. Spinach. Onions. Leeks. Peas. Squash. Tomatoes. Peppers. Radishes. Fruits: Apples. Apricots. Avocado. Berries. Bananas. Cherries. Dates. Figs. Grapes. Lemons. Melon. Oranges. Peaches. Plums. Pomegranate. Meats and other protein foods: Beans. Almonds. Sunflower seeds. Pine nuts. Peanuts. Cod. Salmon. Scallops. Shrimp. Tuna. Tilapia. Clams. Oysters. Eggs. Dairy: Low-fat milk. Cheese. Greek yogurt. Beverages: Water. Red wine. Herbal tea. Fats and oils: Extra virgin olive oil. Avocado oil. Grape seed oil. Sweets and desserts: [place] yogurt with honey. Baked apples. Poached pears. Trail mix. Seasoning and other foods: Basil. Cilantro. Coriander. Cumin. Mint. Parsley. Sage. Rosemary. Tarragon. Garlic. Oregano. Thyme. Pepper. Balsalmic vinegar. Tahini. Hummus. Tomato sauce. Olives. Mushrooms. Limit these Grains: Prepackaged pasta or rice dishes. Prepackaged cereal with added  sugar. Vegetables: Deep fried potatoes (french fries). Fruits: Fruit canned in syrup. Meats and other protein foods: Beef. Pork. Lamb. Poultry with skin. Hot dogs. Tomasa Blase. Dairy: Ice cream. Sour cream. Whole milk. Beverages: Juice. Sugar-sweetened soft drinks. Beer. Liquor and spirits. Fats and oils: Butter. Canola oil. Vegetable oil. Beef fat (tallow). Lard. Sweets and desserts: Cookies. Cakes. Pies. Candy. Seasoning and other foods: Mayonnaise. Premade sauces and marinades. The items listed may not be a complete list. Talk with your dietitian about what dietary choices are right for you. Summary The Mediterranean diet includes both food and lifestyle choices. Eat a variety of fresh fruits and vegetables, beans, nuts, seeds, and whole grains. Limit the amount of red meat and sweets that you eat. Talk with your health care provider about whether it is safe for you to drink red wine in moderation. This means 1 glass a day for nonpregnant women and 2 glasses a day for men. A glass of wine equals 5 oz (150 mL). This information is not intended to replace advice given to you by your health care provider. Make sure you discuss any questions you have with your health care provider. Document Released: 11/07/2015 Document Revised: 12/10/2015 Document Reviewed: 11/07/2015 Elsevier Interactive Patient Education  2017 [employer].

## 2023-03-22 ENCOUNTER — Telehealth: Payer: Medicaid Other | Admitting: Sports Medicine

## 2023-03-22 DIAGNOSIS — M5416 Radiculopathy, lumbar region: Secondary | ICD-10-CM | POA: Diagnosis not present

## 2023-03-22 NOTE — Assessment & Plan Note (Signed)
Please see prior notes, stage III lung adenocarcinoma, right sided radicular symptoms, we discussed this back in the summertime and she did not follow through with an epidural. She reports persistence of discomfort right sided with radiation down the leg to the middle toes consistent with an L5 distribution radiculitis. She did see another provider with [facility] and had L3 and L4 selective epidurals ordered. Considering the distribution of her symptomatology I do think we should proceed first with an L5-S1 transforaminal epidural, we will do this and she can return to see me in 6 weeks.

## 2023-03-22 NOTE — Progress Notes (Signed)
   Virtual Visit via WebEx/MyChart   I connected with  Morgan Garza  on 03/22/23 via WebEx/MyChart/Doximity Video and verified that I am speaking with the correct person using two identifiers.   I discussed the limitations, risks, security and privacy concerns of performing an evaluation and management service by WebEx/MyChart/Doximity Video, including the higher likelihood of inaccurate diagnosis and treatment, and the availability of in person appointments.  We also discussed the likely need of an additional face to face encounter for complete and high quality delivery of care.  I also discussed with the patient that there may be a patient responsible charge related to this service. The patient expressed understanding and wishes to proceed.  Provider location is in medical facility. Patient location is at their home, different from provider location. People involved in care of the patient during this telehealth encounter were myself, my nurse/medical assistant, and my front office/scheduling team member.  Review of Systems: No fevers, chills, night sweats, weight loss, chest pain, or shortness of breath.   Objective Findings:    General: Speaking full sentences, no audible heavy breathing.  Sounds alert and appropriately interactive.  Appears well.  Face symmetric.  Extraocular movements intact.  Pupils equal and round.  No nasal flaring or accessory muscle use visualized.  Independent interpretation of tests performed by another provider:   None.  Brief History, Exam, Impression, and Recommendations:    Right lumbar radiculopathy Please see prior notes, stage III lung adenocarcinoma, right sided radicular symptoms, we discussed this back in the summertime and she did not follow through with an epidural. She reports persistence of discomfort right sided with radiation down the leg to the middle toes consistent with an L5 distribution radiculitis. She did see another provider with  [facility] and had L3 and L4 selective epidurals ordered. Considering the distribution of her symptomatology I do think we should proceed first with an L5-S1 transforaminal epidural, we will do this and she can return to see me in 6 weeks.   I discussed the above assessment and treatment plan with the patient. The patient was provided an opportunity to ask questions and all were answered. The patient agreed with the plan and demonstrated an understanding of the instructions.   The patient was advised to call back or seek an in-person evaluation if the symptoms worsen or if the condition fails to improve as anticipated.   I provided 30 minutes of face to face and non-face-to-face time during this encounter date, time was needed to gather information, review chart, records, communicate/coordinate with staff remotely, as well as complete documentation.   ____________________________________________ Morgan Garza. Benjamin Stain, M.D., ABFM., CAQSM., AME. Primary Care and Sports Medicine [facility]  Adjunct Professor of Family Medicine  [employer] [facility] [employer] [employer] [employer]  Restaurant manager, fast food

## 2023-03-26 ENCOUNTER — Encounter (INDEPENDENT_AMBULATORY_CARE_PROVIDER_SITE_OTHER): Payer: Self-pay | Admitting: Sports Medicine

## 2023-03-26 ENCOUNTER — Encounter: Payer: Self-pay | Admitting: Sports Medicine

## 2023-03-26 ENCOUNTER — Other Ambulatory Visit: Payer: Self-pay

## 2023-03-26 DIAGNOSIS — M1711 Unilateral primary osteoarthritis, right knee: Secondary | ICD-10-CM | POA: Diagnosis not present

## 2023-03-26 MED ORDER — AMLODIPINE BESYLATE 10 MG PO TABS: 10.0000 mg | ORAL_TABLET | Freq: Every day | ORAL | 0 refills | Status: DC

## 2023-03-26 MED ORDER — TRAMADOL HCL 50 MG PO TABS: 50.0000 mg | ORAL_TABLET | Freq: Three times a day (TID) | ORAL | 0 refills | Status: DC | PRN

## 2023-03-26 NOTE — Telephone Encounter (Signed)

## 2023-03-29 ENCOUNTER — Ambulatory Visit: Payer: Medicaid Other

## 2023-03-29 DIAGNOSIS — Z1231 Encounter for screening mammogram for malignant neoplasm of breast: Secondary | ICD-10-CM

## 2023-04-01 ENCOUNTER — Other Ambulatory Visit: Payer: Medicaid Other

## 2023-04-06 NOTE — Discharge Instructions (Signed)

## 2023-04-07 ENCOUNTER — Ambulatory Visit
Admission: RE | Admit: 2023-04-07 | Discharge: 2023-04-07 | Disposition: A | Discharge status: Home / Self Care | Payer: Medicaid Other | Source: Ambulatory Visit | Attending: Sports Medicine | Admitting: Sports Medicine

## 2023-04-07 ENCOUNTER — Telehealth: Payer: Self-pay

## 2023-04-07 DIAGNOSIS — M5416 Radiculopathy, lumbar region: Secondary | ICD-10-CM

## 2023-04-07 MED ORDER — METHYLPREDNISOLONE ACETATE 40 MG/ML INJ SUSP (RADIOLOG
80.0000 mg | Freq: Once | INTRAMUSCULAR | Status: AC
  Administered 2023-04-07: 80 mg via EPIDURAL

## 2023-04-07 MED ORDER — IOPAMIDOL (ISOVUE-M 200) INJECTION 41%
1.0000 mL | Freq: Once | INTRAMUSCULAR | Status: AC
  Administered 2023-04-07: 1 mL via EPIDURAL

## 2023-04-07 NOTE — Telephone Encounter (Signed)
 Copied from CRM [id number]. Topic: Clinical - Home Health Verbal Orders >> Apr 06, 2023  2:59 PM Franky GRADE wrote: Caller/Agency: [facility] / Outpatient Diagnostic Imaging & Radiology  [facility] Number: [phone] Service Requested: This call is in regards to a Denial on an injection for a nerve blocker that was ordered for patient to have. Frequency:  Any new concerns about the patient?

## 2023-04-07 NOTE — Telephone Encounter (Signed)
 Okay a nerve blocker like a medication?  Or a nerve block like an injection?  Looks like she got the injection.  The nerve blocker that she is on is gabapentin and that is generic.

## 2023-04-09 ENCOUNTER — Ambulatory Visit: Payer: Medicaid Other | Admitting: Family Medicine

## 2023-04-15 NOTE — Telephone Encounter (Signed)
Spoke with Morgan Garza at outpatient diagnostic imaging States she was just letting Dr. Benjamin Stain know that the  Patient only had the injection for the L5 level. States insurance approved L5 level injection but denied the S1 level injeciton  If Dr. Benjamin Stain feels the S1 level injection is needed still he could do a pier to pier for approval of injection at this level.

## 2023-05-04 ENCOUNTER — Other Ambulatory Visit: Payer: Self-pay | Admitting: Family Medicine

## 2023-05-04 DIAGNOSIS — F5101 Primary insomnia: Secondary | ICD-10-CM

## 2023-05-10 ENCOUNTER — Ambulatory Visit: Payer: Medicaid Other | Admitting: Family Medicine

## 2023-06-17 ENCOUNTER — Telehealth: Payer: Self-pay | Admitting: Neurology

## 2023-06-17 NOTE — Telephone Encounter (Signed)
 Error

## 2023-06-21 ENCOUNTER — Ambulatory Visit (INDEPENDENT_AMBULATORY_CARE_PROVIDER_SITE_OTHER): Payer: Medicaid Other | Admitting: Neurology

## 2023-06-21 ENCOUNTER — Encounter: Payer: Self-pay | Admitting: Neurology

## 2023-06-21 VITALS — BP 141/79 | HR 68 | Ht 68.0 in | Wt 268.0 lb

## 2023-06-21 DIAGNOSIS — F419 Anxiety disorder, unspecified: Secondary | ICD-10-CM | POA: Diagnosis not present

## 2023-06-21 DIAGNOSIS — R4189 Other symptoms and signs involving cognitive functions and awareness: Secondary | ICD-10-CM

## 2023-06-21 NOTE — Patient Instructions (Addendum)
 Continue current medications  Will obtain ATN profile to look for Alzheimer disease biomarkers  Referral for formal neuropsychological testing Continue to follow-up with your doctors Consider second opinion but at an academic center such as [facility] or [facility] with a behavioral neurologist Follow-up in 1 year or sooner if worse.

## 2023-06-21 NOTE — Progress Notes (Signed)
 [facility]  PATIENT: Morgan Garza DOB: 12-31-1960  REQUESTING CLINICIAN: Charlton Amor, DO HISTORY FROM: Patient/Daughter and Son REASON FOR VISIT: AMS/Memory loss    HISTORICAL  CHIEF COMPLAINT:  Chief Complaint  Patient presents with   Follow-up    PT IN 12, here alone  Pt is following up on cognitive impairment.    INTERVAL HISTORY 06/21/2023:  Patient presents today for follow-up, last visit was 6 months ago.  Since then, she tells me that her life has completely changed.  She is no longer working, she is getting lost when driving to familiar places resulting in her not driving a lot, kids do take her places.  She is planning to travel sometime this year.  She is still independent all of her ADLs.  We have repeated her MRI brain, did not show any acute abnormality.  She tells me that her anxiety is there but is better than previously.  She has not seen psychiatry since last visit.   HISTORY OF PRESENT ILLNESS:  This is a 63 year old woman past medical history of hypertension, hyperlipidemia, diabetes, anxiety who is presenting with her daughter Morgan Garza and son Morgan Garza with complaints of memory change for the past year. Patient works as a Designer, jewellery, very independent and last year she had assignment in [place], while in [place], in June 2023, she started having symptoms of abnormal sensation, possibly exposure to toxins in her AIR B&B apartment.  At that time she was also noted to have some memory changes.  She was briefly hospitalized in [place], her workup at that time was unrevealing.  She was also experiencing some cough.  Son has traveled [place] to bring her back to [place]. In October 2023 noted to have a lung nodule, this nodule was followed, she continued to experience cough and shortness of breath and had a biopsy in April 2024 and was diagnosed with adenocarcinoma of the lung.  Of note patient was a smoker and she recently quit.  She  was started on radiation and chemotherapy, her last chemo was on August 12 and her last radiation August 19. Family reports since June 2023, she started developing memory problem difficulty with recall, she gets tired easily distracted that was the reason why son has to travel to [place] to bring her back.  Her cognitive impairment has not improved, actually got worse, family reported that she is forgetful, short patience, anxious, she has to write everything down.  She made mistakes with her bills, paying the bills multiple times.  Son did report an event where she was trying to pump gas but did not know how to pump gas properly, she was driving with low air in her tires and son has to put air in the tires.  When driving with her grandkids into a very family area, she will still need directions to get home. 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    TBI:   No past history of TBI Stroke:   no past history of stroke Seizures:   no past history of seizures Sleep:   no history of sleep apnea.  Mood:  patient denies anxiety and depression Family history of Dementia: Denies  Functional status:  Patient lives with son. Cooking: not a lot  Cleaning:  no issues  Shopping: no issues  Bathing: no issues  Toileting: no issues  Driving: not a lot due to being lost Bills: Paid bills multiple times  Medications: no issues  Ever left the stove on by accident?: yes Forget how to use items around the house?: yes  Getting lost going to familiar places?: Yes Forgetting loved ones names?: No  Word finding difficulty? Yes Sleep: Difficulty with sleep    OTHER MEDICAL CONDITIONS: Diabetes Mellitus, Hypertension, Hyperlipidemia, Anxiety   REVIEW OF SYSTEMS: Full 14 system  review of systems performed and negative with exception of: As noted in the HPI  ALLERGIES: Allergies  Allergen Reactions   Bupropion Hives         HOME MEDICATIONS: Outpatient Medications Prior to Visit  Medication Sig Dispense Refill   ALPRAZolam (XANAX) 1 MG tablet Take 1 tablet (1 mg total) by mouth as needed for anxiety. 30 tablet 0   amLODipine (NORVASC) 10 MG tablet Take 1 tablet (10 mg total) by mouth daily. 90 tablet 0   atorvastatin (LIPITOR) 20 MG tablet Take 1 tablet (20 mg total) by mouth daily. 90 tablet 3   Blood Glucose Monitoring Suppl (ACCU-CHEK AVIVA PLUS) w/Device KIT Check fasting glucose every morning and 2 hours after largest meal. 1 kit PRN   Blood Glucose Monitoring Suppl DEVI 1 each by Does not apply route in the morning, at noon, and at bedtime. May substitute to any manufacturer covered by patient's insurance.  Patient's insurance prefers Accu-Chek brand 1 each 0   Cholecalciferol (VITAMIN D3) 5000 units TABS Take 1,000 Units by mouth.     diclofenac Sodium (VOLTAREN) 1 % GEL APPLY 2 GRAMS  TO KNEE FOUR TIMES A DAY 100 g 2   gabapentin (NEURONTIN) 300 MG capsule Take 1 capsule (300 mg total) by mouth 3 (three) times daily. 270 capsule 3   glucose blood (ACCU-CHEK AVIVA PLUS) test strip Check fasting glucose every morning and 2 hours after largest meal. 100 each 12   glucose blood test strip Use one strip to test blood sugar.     Lancets 30G MISC Check fasting glucose every morning and 2 hours after largest meal. 100 each PRN   lisinopril-hydrochlorothiazide (ZESTORETIC) 20-25 MG tablet Take 1 tablet by mouth daily. 90 tablet 3   metFORMIN (GLUCOPHAGE-XR) 500 MG 24 hr tablet TAKE TWO TABLETS BY MOUTH EVERY MORNING WITH BREAKFAST 180 tablet 3   Semaglutide (RYBELSUS) 7 MG TABS Take 1 tablet (7 mg total) by mouth daily. 90 tablet 1   traZODone (DESYREL) 50 MG tablet TAKE 1/2 TO 1 TABLET BY MOUTH EVERY NIGHT AT BEDTIME AS NEEDED FOR SLEEP 30 tablet 1   citalopram  (CELEXA) 20 MG tablet Take 1 tablet (20 mg total) by mouth daily. (Patient not taking: Reported on 06/21/2023) 90 tablet 1   traMADol (ULTRAM) 50 MG tablet Take 1 tablet (50 mg total) by mouth every 8 (eight) hours as needed for moderate pain (pain score 4-6). (Patient not taking: Reported on 06/21/2023) 21 tablet 0   No facility-administered medications prior to visit.    PAST MEDICAL HISTORY: Past Medical History:  Diagnosis Date   Anemia    Arthritis    Diabetes mellitus without complication (HCC)    History of colon polyps    Hyperlipidemia    Hypertension    Lung cancer (HCC)    Obesity     PAST SURGICAL HISTORY: Past Surgical History:  Procedure Laterality Date   COLONOSCOPY  07/07/2012  [facility]   NO PAST SURGERIES      FAMILY HISTORY: Family History  Problem Relation Age of Onset   Hypertension Mother    COPD Mother    Cancer Father    Gastric cancer Father    Diabetes Daughter    Testicular cancer Son    Diabetes Son    Esophageal cancer Neg Hx    Colon cancer Neg Hx     SOCIAL HISTORY: Social History   Socioeconomic History   Marital status: Divorced    Spouse name: Not on file   Number of children: 4   Years of education: Not on file   Highest education level: Bachelor's degree (e.g., BA, AB, BS)  Occupational History    Comment: [employer]  Tobacco Use   Smoking status: Former    Current packs/day: 0.50    Average packs/day: 0.5 packs/day for 1 year (0.5 ttl pk-yrs)    Types: Cigarettes   Smokeless tobacco: Never  Vaping Use   Vaping status: Never Used  Substance and Sexual Activity   Alcohol use: Not Currently    Alcohol/week: 1.0 standard drink of alcohol    Types: 1 Standard drinks or equivalent per week    Comment: ocassionally   Drug use: Never   Sexual activity: Not on file  Other Topics Concern   Not on file  Social History Narrative      Are you right handed or left handed? Right Handed   Are you  currently employed ? No    What is your current occupation?   Do you live at home alone?No    Who lives with you? Son   What type of home do you live in: 1 story or 2 story? Two story home        Social Drivers of Health   Financial Resource Strain: High Risk (08/01/2022)   Overall Financial Resource Strain (CARDIA)    Difficulty of Paying Living Expenses: Very hard  Food Insecurity: Food Insecurity Present (08/01/2022)   Hunger Vital Sign    Worried About Running Out of Food in the Last Year: Sometimes true    Ran Out of Food in the Last Year: Often true  Transportation Needs: No Transportation Needs (08/01/2022)   PRAPARE - Administrator, Civil Service (Medical): No    Lack of Transportation (Non-Medical): No  Physical Activity: Unknown (08/01/2022)   Exercise Vital Sign    Days of Exercise per Week: 0 days    Minutes of Exercise per Session: Not on file  Stress: Stress Concern Present (08/01/2022)   [employer] of Occupational Health - Occupational Stress Questionnaire    Feeling of Stress : To some extent  Social Connections: Moderately Isolated (08/01/2022)   Social Connection and Isolation Panel [NHANES]    Frequency of Communication with Friends and Family: More than three times a week    Frequency of Social Gatherings with Friends and Family: More than three times a week    Attends Religious Services: 1 to 4 times per year    Active Member of [employer] or Organizations: No    Attends Banker Meetings: Not on file    Marital Status: Divorced  Intimate Partner Violence: Not At Risk (01/01/2023)   Received from [facility]   HITS    Over the last 12 months how often did your partner physically hurt you?: Never    Over the last 12 months how often did your partner insult  you or talk down to you?: Never    Over the last 12 months how often did your partner threaten you with physical harm?: Never    Over the last 12 months how often did your partner scream  or curse at you?: Never    PHYSICAL EXAM  GENERAL EXAM/CONSTITUTIONAL: Vitals:  Vitals:   06/21/23 1556  BP: (!) 141/79  Pulse: 68  Weight: 268 lb (121.6 kg)  Height: 5\' 8"  (1.727 m)    Body mass index is 40.75 kg/m. Wt Readings from Last 3 Encounters:  06/21/23 268 lb (121.6 kg)  03/18/23 262 lb (118.8 kg)  01/07/23 250 lb 12 oz (113.7 kg)   Patient is in no distress; well developed, nourished and groomed; neck is supple  MUSCULOSKELETAL: Gait, strength, tone, movements noted in Neurologic exam below  NEUROLOGIC: MENTAL STATUS:      No data to display            06/21/2023    4:00 PM 03/18/2023    9:00 AM 12/09/2022    3:28 PM  Montreal Cognitive Assessment   Visuospatial/ Executive (0/5) 3 2 3   Naming (0/3) 3 1 2   Attention: Read list of digits (0/2) 2 1 1   Attention: Read list of letters (0/1) 1 1 1   Attention: Serial 7 subtraction starting at 100 (0/3) 3 2 3   Language: Repeat phrase (0/2) 0 1 0  Language : Fluency (0/1) 1 1 1   Abstraction (0/2) 2 1 2   Delayed Recall (0/5) 2 3 2   Orientation (0/6) 5 6 4   Total 22 19 19   Adjusted Score (based on education)  19      CRANIAL NERVE:  2nd, 3rd, 4th, 6th- visual fields full to confrontation, extraocular muscles intact, no nystagmus 5th - facial sensation symmetric 7th - facial strength symmetric 8th - hearing intact 9th - palate elevates symmetrically, uvula midline 11th - shoulder shrug symmetric 12th - tongue protrusion midline  MOTOR:  normal bulk and tone, full strength in the BUE, BLE  SENSORY:  normal and symmetric to light touch  COORDINATION:  finger-nose-finger, fine finger movements normal  GAIT/STATION:  normal   DIAGNOSTIC DATA (LABS, IMAGING, TESTING) - I reviewed patient records, labs, notes, testing and imaging myself where available.  Lab Results  Component Value Date   WBC 2.8 (L) 12/08/2022   HGB 12.2 12/08/2022   HCT 39.1 12/08/2022   MCV 87 12/08/2022   PLT 222  12/08/2022      Component Value Date/Time   NA 140 12/08/2022 1014   K 4.0 12/08/2022 1014   CL 101 12/08/2022 1014   CO2 22 12/08/2022 1014   GLUCOSE 148 (H) 12/08/2022 1014   GLUCOSE 108 (H) 09/09/2022 1138   BUN 25 12/08/2022 1014   CREATININE 0.99 12/08/2022 1014   CREATININE 0.87 09/09/2022 1138   CALCIUM 9.5 12/08/2022 1014   PROT 7.1 12/08/2022 1014   ALBUMIN 4.3 12/08/2022 1014   AST 17 12/08/2022 1014   ALT 14 12/08/2022 1014   ALKPHOS 82 12/08/2022 1014   BILITOT 0.3 12/08/2022 1014   GFRNONAA 78 06/06/2020 0000   GFRAA 90 06/06/2020 0000   Lab Results  Component Value Date   CHOL 177 12/08/2022   HDL 56 12/08/2022   LDLCALC 99 12/08/2022   TRIG 124 12/08/2022   CHOLHDL 3.2 12/08/2022   Lab Results  Component Value Date   HGBA1C 8.6 (H) 12/08/2022   Lab Results  Component Value Date   VITAMINB12 587 09/09/2022  No results found for: "TSH"  MRI Brain with and without 12/08/2022 1.  No acute intracranial abnormality.  2.  No specific evidence for intracranial metastatic disease.  3.  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 Nonemergent CTA of the head could further differentiate and exclude a small arterial aneurysm.    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  None of these appear to be acute. Normal enhancement pattern.  No acute findings.    ASSESSMENT AND PLAN  63 y.o. year old female with past medical history including hypertension, hyperlipidemia, diabetes mellitus and anxiety who is presenting for follow for her cognitive impairment described as memory loss, confusion, difficulty with recall, having trouble with  direction.  Her extensive workup so far has been negative.  Today her MoCA improved to 22 compared to 19 at last visit.  Plan will be to obtain ATN profile to look for Alzheimer disease biomarker, and to refer patient for formal neuropsychological testing.  Advised to continue current medications, to increase her exercise and I will see her in a year for follow-up or sooner if worse. If she wants to pursue a second opinion, she should be referred with a Behavior neurologist at an academic center. She voiced understanding.   1. Cognitive impairment   2. Anxiety      Patient Instructions  Continue current medications  Will obtain ATN profile to look for Alzheimer disease biomarkers  Referral for formal neuropsychological testing Continue to follow-up with your doctors Consider second opinion but at an academic center such as [facility] or [facility] with a behavioral neurologist Follow-up in 1 year or sooner if worse.   Orders Placed This Encounter  Procedures   ATN PROFILE   Ambulatory referral to Neuropsychology    No orders of the defined types were placed in this encounter.   Return in about 1 year (around 06/20/2024).   Windell Norfolk, MD 06/21/2023, 4:45 PM  [facility] [address] [place], [place] [address] [phone]

## 2023-06-23 ENCOUNTER — Telehealth: Payer: Self-pay | Admitting: Neurology

## 2023-06-23 NOTE — Telephone Encounter (Signed)
 Referral for neuropsychology fax to [facility]. [phone], Fax: [phone]

## 2023-06-24 ENCOUNTER — Encounter: Payer: Self-pay | Admitting: Neurology

## 2023-06-24 LAB — ATN PROFILE
A -- Beta-amyloid 42/40 Ratio: 0.111 (ref >0.102)
Beta-amyloid 40: 177.03 pg/mL
Beta-amyloid 42: 19.68 pg/mL
N -- NfL, Plasma: 2.06 pg/mL (ref 0.00–4.61)
T -- p-tau181: 0.55 pg/mL (ref 0.00–0.97)

## 2023-07-24 ENCOUNTER — Other Ambulatory Visit: Payer: Self-pay | Admitting: Family Medicine

## 2023-07-24 DIAGNOSIS — E119 Type 2 diabetes mellitus without complications: Secondary | ICD-10-CM

## 2023-07-26 ENCOUNTER — Telehealth: Payer: Self-pay

## 2023-07-26 ENCOUNTER — Other Ambulatory Visit (HOSPITAL_COMMUNITY): Payer: Self-pay

## 2023-07-26 NOTE — Telephone Encounter (Signed)
 Pharmacy Patient Advocate Encounter   Received notification from Patient Pharmacy that prior authorization for Ozempic  2 is required/requested.   Insurance verification completed.   The patient is insured through [facility] .   Per test claim: PA required; PA submitted to above mentioned insurance via CoverMyMeds Key/confirmation #/EOC [facility] Status is pending

## 2023-07-27 ENCOUNTER — Other Ambulatory Visit (HOSPITAL_COMMUNITY): Payer: Self-pay

## 2023-07-27 NOTE — Telephone Encounter (Signed)
 Pharmacy Patient Advocate Encounter  Received notification from [facility] that Prior Authorization for Ozempic  2  has been DENIED.  Full denial letter will be uploaded to the media tab. See denial reason below.   PA #/Case ID/Reference #: [id number]

## 2023-07-28 ENCOUNTER — Other Ambulatory Visit (HOSPITAL_COMMUNITY): Payer: Self-pay

## 2023-07-29 ENCOUNTER — Other Ambulatory Visit: Payer: Self-pay | Admitting: Family Medicine

## 2023-08-05 ENCOUNTER — Encounter: Payer: Self-pay | Admitting: Family Medicine

## 2023-08-19 ENCOUNTER — Other Ambulatory Visit: Payer: Self-pay | Admitting: Family Medicine

## 2023-08-19 DIAGNOSIS — F5101 Primary insomnia: Secondary | ICD-10-CM

## 2023-09-06 ENCOUNTER — Ambulatory Visit
Admission: EM | Admit: 2023-09-06 | Discharge: 2023-09-06 | Disposition: A | Discharge status: Home / Self Care | Attending: Family Medicine | Admitting: Family Medicine

## 2023-09-06 ENCOUNTER — Ambulatory Visit

## 2023-09-06 DIAGNOSIS — S4991XA Unspecified injury of right shoulder and upper arm, initial encounter: Secondary | ICD-10-CM

## 2023-09-06 DIAGNOSIS — M25511 Pain in right shoulder: Secondary | ICD-10-CM | POA: Diagnosis not present

## 2023-09-06 DIAGNOSIS — M62830 Muscle spasm of back: Secondary | ICD-10-CM | POA: Diagnosis not present

## 2023-09-06 MED ORDER — METHOCARBAMOL 500 MG PO TABS: 500.0000 mg | ORAL_TABLET | Freq: Three times a day (TID) | ORAL | 0 refills | Status: AC | PRN

## 2023-09-06 NOTE — Discharge Instructions (Addendum)
 Advised patient may take Robaxin daily or as needed for right trapezius spasm.  Advised patient we will follow-up with you tomorrow morning Tuesday, 09/07/2023 with right shoulder x-ray results.

## 2023-09-06 NOTE — ED Provider Notes (Signed)
 [facility]    CSN: [id number] Arrival date & time: 09/06/23  1957      History   Chief Complaint Chief Complaint  Patient presents with   Shoulder Injury    HPI Morgan Garza is a 63 y.o. female.   HPI 63 year old female presents with right sided neck and right shoulder pain secondary to fall.  Patient reports she may have fallen 1 week ago onto her right shoulder PMH significant for morbid/severe obesity, cognitive impairment, T2DM without complication, and lung cancer.  Past Medical History:  Diagnosis Date   Anemia    Arthritis    Diabetes mellitus without complication (HCC)    History of colon polyps    Hyperlipidemia    Hypertension    Lung cancer (HCC)    Obesity     Patient Active Problem List   Diagnosis Date Noted   Primary insomnia 12/10/2022   Stage III adenocarcinoma of lung (HCC) 09/24/2022   Memory deficit 09/09/2022   Primary osteoarthritis of right knee 06/09/2022   Encounter for smoking cessation counseling 06/09/2022   Anxiousness 06/09/2022   Physical exam 04/27/2022   Cervicalgia 04/06/2018   Closed head injury with concussion 04/06/2018   Fall (on) (from) other stairs and steps, initial encounter 04/06/2018   Cigarette nicotine  dependence without complication 02/06/2018   Sebaceous cyst of labia 02/06/2018   Mass of axilla, left 02/04/2018   Postmenopausal 02/02/2018   History of colon polyps    Allergic rhinitis 09/04/2015   BMI 39.0-39.9,adult 09/04/2015   Primary hypertension 09/04/2015   Hyperlipidemia 09/04/2015   Type 2 diabetes mellitus without complication, without long-term current use of insulin (HCC) 04/26/2012   Right lumbar radiculopathy 04/22/2012    Past Surgical History:  Procedure Laterality Date   COLONOSCOPY  07/07/2012   [facility]   NO PAST SURGERIES      OB History     Gravida  6   Para  4   Term      Preterm      AB  2   Living  4      SAB  1   IAB  1    Ectopic      Multiple      Live Births               Home Medications    Prior to Admission medications   Medication Sig Start Date End Date Taking? Authorizing Provider  methocarbamol (ROBAXIN) 500 MG tablet Take 1 tablet (500 mg total) by mouth 3 (three) times daily as needed for up to 7 days. 09/06/23 09/13/23 Yes Leonides Ramp, FNP  ALPRAZolam  (XANAX ) 1 MG tablet Take 1 tablet (1 mg total) by mouth as needed for anxiety. 03/15/23   Josepha Nickels, DO  amLODipine  (NORVASC ) 10 MG tablet TAKE 1 TABLET BY MOUTH DAILY 07/26/23   Josepha Nickels, DO  atorvastatin  (LIPITOR) 20 MG tablet TAKE 1 TABLET BY MOUTH DAILY 08/03/23   Wachs, Erika S, DO  Blood Glucose Monitoring Suppl (ACCU-CHEK AVIVA PLUS) w/Device KIT Check fasting glucose every morning and 2 hours after largest meal. 02/23/23   Dianah Fort, Erika S, DO  Blood Glucose Monitoring Suppl DEVI 1 each by Does not apply route in the morning, at noon, and at bedtime. May substitute to any manufacturer covered by patient's insurance.  Patient's insurance prefers Accu-Chek brand 02/23/23   Cydney Draft, MD  Cholecalciferol (VITAMIN D3) 5000 units TABS Take 1,000 Units by mouth.  [provider]  citalopram  (CELEXA ) 20 MG tablet Take 1 tablet (20 mg total) by mouth daily. Patient not taking: Reported on 06/21/2023 03/15/23   Josepha Nickels, DO  diclofenac  Sodium (VOLTAREN ) 1 % GEL APPLY 2 GRAMS  TO KNEE FOUR TIMES A DAY 01/21/23   Alyne Jules S, DO  gabapentin  (NEURONTIN ) 300 MG capsule Take 1 capsule (300 mg total) by mouth 3 (three) times daily. 03/15/23   Gean Keels, MD  glipiZIDE  (GLUCOTROL  XL) 10 MG 24 hr tablet TAKE 1 TABLET BY MOUTH DAILY WITH BREAKFAST 07/26/23   Wachs, Erika S, DO  glucose blood (ACCU-CHEK AVIVA PLUS) test strip Check fasting glucose every morning and 2 hours after largest meal. 02/23/23   Alyne Jules S, DO  glucose blood test strip Use one strip to test blood sugar. 09/03/16   [provider]  Lancets 30G MISC Check fasting glucose every morning and 2 hours after largest meal. 02/23/23   Josepha Nickels, DO  lisinopril -hydrochlorothiazide  (ZESTORETIC ) 20-25 MG tablet TAKE 1 TABLET BY MOUTH DAILY 07/26/23   Josepha Nickels, DO  metFORMIN  (GLUCOPHAGE -XR) 500 MG 24 hr tablet TAKE TWO TABLETS BY MOUTH EVERY MORNING WITH BREAKFAST 03/15/23   Alyne Jules S, DO  Semaglutide  (RYBELSUS ) 7 MG TABS Take 1 tablet (7 mg total) by mouth daily. 03/15/23   Josepha Nickels, DO  traMADol  (ULTRAM ) 50 MG tablet Take 1 tablet (50 mg total) by mouth every 8 (eight) hours as needed for moderate pain (pain score 4-6). Patient not taking: Reported on 06/21/2023 03/26/23   Gean Keels, MD  traZODone  (DESYREL ) 50 MG tablet TAKE A HALF TO 1 TABLET BY MOUTH EVERY NIGHT AT BEDTIME AS NEEDED FOR SLEEP 08/20/23   Araceli Knight, PA-C    Family History Family History  Problem Relation Age of Onset   Hypertension Mother    COPD Mother    Cancer Father    Gastric cancer Father    Diabetes Daughter    Testicular cancer Son    Diabetes Son    Esophageal cancer Neg Hx    Colon cancer Neg Hx     Social History Social History   Tobacco Use   Smoking status: Former    Current packs/day: 0.50    Average packs/day: 0.5 packs/day for 1 year (0.5 ttl pk-yrs)    Types: Cigarettes   Smokeless tobacco: Never  Vaping Use   Vaping status: Never Used  Substance Use Topics   Alcohol use: Not Currently    Alcohol/week: 1.0 standard drink of alcohol    Types: 1 Standard drinks or equivalent per week    Comment: ocassionally   Drug use: Never     Allergies   Bupropion   Review of Systems Review of Systems  Musculoskeletal:        Right shoulder pain/muscle spasms of right shoulder  All other systems reviewed and are negative.    Physical Exam Triage Vital Signs ED Triage Vitals  Encounter Vitals Group     BP      Systolic BP Percentile      Diastolic BP Percentile       Pulse      Resp      Temp      Temp src      SpO2      Weight      Height      Head Circumference      Peak Flow  Pain Score      Pain Loc      Pain Education      Exclude from Growth Chart    No data found.  Updated Vital Signs Pulse 68   Temp 98.4 F (36.9 C)   Resp 16   LMP 11/09/2010   SpO2 98%    Physical Exam Vitals and nursing note reviewed.  Constitutional:      Appearance: Normal appearance. She is normal weight.  HENT:     Head: Normocephalic and atraumatic.     Mouth/Throat:     Mouth: Mucous membranes are moist.     Pharynx: Oropharynx is clear.  Eyes:     Extraocular Movements: Extraocular movements intact.     Conjunctiva/sclera: Conjunctivae normal.     Pupils: Pupils are equal, round, and reactive to light.  Cardiovascular:     Rate and Rhythm: Normal rate and regular rhythm.     Pulses: Normal pulses.     Heart sounds: Normal heart sounds.  Pulmonary:     Effort: Pulmonary effort is normal.     Breath sounds: Normal breath sounds. No wheezing, rhonchi or rales.  Musculoskeletal:        General: Normal range of motion.     Comments: Right shoulder (medial posterior aspect): TTP with limited range of motion, exam limited due to pain TTP over right trapezius muscle, with palpable muscle adhesions noted  Skin:    General: Skin is warm and dry.  Neurological:     General: No focal deficit present.     Mental Status: She is alert and oriented to person, place, and time. Mental status is at baseline.  Psychiatric:        Mood and Affect: Mood normal.        Behavior: Behavior normal.      UC Treatments / Results  Labs (all labs ordered are listed, but only abnormal results are displayed) Labs Reviewed - No data to display  EKG   Radiology DG Shoulder Right Result Date: 09/06/2023 CLINICAL DATA:  Right shoulder pain secondary to fall EXAM: RIGHT SHOULDER - 2+ VIEW COMPARISON:  January 03, 2018 FINDINGS: No acute fracture or dislocation.  Apparent AC joint narrowing. Soft tissues are unremarkable. IMPRESSION: 1. No acute fracture or dislocation. 2. Apparent narrowing of the [facility] joint, as can be seen with osteoarthritis. Electronically Signed   By: Rance Burrows M.D.   On: 09/06/2023 20:27    Procedures Procedures (including critical care time)  Medications Ordered in UC Medications - No data to display  Initial Impression / Assessment and Plan / UC Course  I have reviewed the triage vital signs and the nursing notes.  Pertinent labs & imaging results that were available during my care of the patient were reviewed by me and considered in my medical decision making (see chart for details).     MDM: 1.  Acute pain of right shoulder-right shoulder x-ray results revealed above, patient advised, Rx'd Celebrex 200 mg capsule: Take 1 capsule daily x 15 days; 2.  Injury of right shoulder, initial encounter-right shoulder x-ray results revealed above, patient advised; 3.  Spasm of right trapezius muscle-Rx'd Robaxin 500 mg tablet: Take 1 tablet 3 times daily, as needed for right trapezius spasm. Advised patient may take Robaxin daily or as needed for right trapezius spasm.  Advised patient we will follow-up with you tomorrow morning Tuesday, 09/07/2023 with right shoulder x-ray results.  Advised patient if symptoms worsen and/or unresolved please follow-up  with your PCP, [facility] orthopedics or here for further evaluation.  Final Clinical Impressions(s) / UC Diagnoses   Final diagnoses:  Acute pain of right shoulder  Injury of right shoulder, initial encounter  Spasm of right trapezius muscle     Discharge Instructions      Advised patient may take Robaxin daily or as needed for right trapezius spasm.  Advised patient we will follow-up with you tomorrow morning Tuesday, 09/07/2023 with right shoulder x-ray results.  Advised patient if symptoms worsen and/or unresolved please follow-up with your PCP, [facility]oped[facility] or  here for further evaluation.   ED Prescriptions     Medication Sig Dispense Auth. Provider   methocarbamol (ROBAXIN) 500 MG tablet Take 1 tablet (500 mg total) by mouth 3 (three) times daily as needed for up to 7 days. 21 tablet Julane Crock, FNP      PDMP not reviewed this encounter.   Leonides Ramp, FNP 09/07/23 1025

## 2023-09-06 NOTE — ED Triage Notes (Signed)
 Pt presents to uc with co muscle pain in right shoulder and neck pain since her fall last week when she fell she may have hit it on the way down.

## 2023-09-07 ENCOUNTER — Encounter: Payer: Self-pay | Admitting: Family Medicine

## 2023-09-07 ENCOUNTER — Telehealth: Payer: Self-pay | Admitting: Family Medicine

## 2023-09-07 ENCOUNTER — Ambulatory Visit (HOSPITAL_BASED_OUTPATIENT_CLINIC_OR_DEPARTMENT_OTHER): Payer: Self-pay

## 2023-09-07 MED ORDER — CELECOXIB 200 MG PO CAPS: 200.0000 mg | ORAL_CAPSULE | Freq: Every day | ORAL | 0 refills | Status: DC

## 2023-09-07 NOTE — Telephone Encounter (Signed)
 Celebrex sent to patient's pharmacy for right [facility] joint osteoarthritis.  Patient advised.

## 2023-09-08 ENCOUNTER — Other Ambulatory Visit: Payer: Self-pay | Admitting: Family Medicine

## 2023-09-09 ENCOUNTER — Other Ambulatory Visit (HOSPITAL_COMMUNITY): Payer: Self-pay

## 2023-09-09 ENCOUNTER — Telehealth: Payer: Self-pay

## 2023-09-09 NOTE — Telephone Encounter (Signed)
 Pharmacy Patient Advocate Encounter   Received notification from CoverMyMeds that prior authorization for Ozempic  2mg /79ml is required/requested.   Insurance verification completed.   The patient is insured through [facility] .   Per chart notes: Ozempic  has been discontinued and the patient is not on Ryblelsus 7mg  tabs. Placed a call to [facility] pharmacy to let them know the Ozempic  has been discontinued.

## 2023-09-16 ENCOUNTER — Encounter: Payer: Self-pay | Admitting: Urgent Care

## 2023-09-16 ENCOUNTER — Ambulatory Visit: Admitting: Urgent Care

## 2023-09-16 VITALS — BP 124/79 | HR 66 | Resp 18 | Ht 68.0 in | Wt 280.2 lb

## 2023-09-16 DIAGNOSIS — E7849 Other hyperlipidemia: Secondary | ICD-10-CM

## 2023-09-16 DIAGNOSIS — E119 Type 2 diabetes mellitus without complications: Secondary | ICD-10-CM

## 2023-09-16 DIAGNOSIS — C3492 Malignant neoplasm of unspecified part of left bronchus or lung: Secondary | ICD-10-CM | POA: Diagnosis not present

## 2023-09-16 DIAGNOSIS — I10 Essential (primary) hypertension: Secondary | ICD-10-CM | POA: Diagnosis not present

## 2023-09-16 DIAGNOSIS — R062 Wheezing: Secondary | ICD-10-CM

## 2023-09-16 DIAGNOSIS — R0602 Shortness of breath: Secondary | ICD-10-CM

## 2023-09-16 DIAGNOSIS — R600 Localized edema: Secondary | ICD-10-CM

## 2023-09-16 DIAGNOSIS — F419 Anxiety disorder, unspecified: Secondary | ICD-10-CM | POA: Diagnosis not present

## 2023-09-16 DIAGNOSIS — F5101 Primary insomnia: Secondary | ICD-10-CM

## 2023-09-16 DIAGNOSIS — Z7984 Long term (current) use of oral hypoglycemic drugs: Secondary | ICD-10-CM

## 2023-09-16 DIAGNOSIS — Z7985 Long-term (current) use of injectable non-insulin antidiabetic drugs: Secondary | ICD-10-CM

## 2023-09-16 LAB — POCT GLYCOSYLATED HEMOGLOBIN (HGB A1C): Hemoglobin A1C: 9.5 % — AB (ref 4.0–5.6)

## 2023-09-16 MED ORDER — OZEMPIC (0.25 OR 0.5 MG/DOSE) 2 MG/1.5ML ~~LOC~~ SOPN: PEN_INJECTOR | SUBCUTANEOUS | 1 refills | Status: AC

## 2023-09-16 NOTE — Patient Instructions (Addendum)
 Please start 0.25mg  ozemic weekly x 4 weeks, then increase to 0.5mg  weekly x 4 weeks.  Please check your glucose at least once a day and write it down for me: Fasting goal: <120 1 hour after eating: <180 2 hours after eating: <140  I have called in a medication to help with the swelling. Please take one daily for the next 5-7 days. Please elevate your legs above the level of your heart when seated.  Use your Ventolin inhaler at home 2 puffs every 6 hours as needed. If any new or worsening symptoms, please notify our office.  Please return in 6 weeks for recheck and follow up.

## 2023-09-16 NOTE — Progress Notes (Signed)
 Established Patient Office Visit  Subjective:  Patient ID: Morgan Garza, female    DOB: February 24, 1961  Age: 63 y.o. MRN: [id number]  Chief Complaint  Patient presents with   Medical Management of Chronic Issues    DM f/u edema legs and feet, SOB x4-5 days. Pt also states she has had excessive weight gain lately    HPI  Discussed the use of AI scribe software for clinical note transcription with the patient, who gave verbal consent to proceed.  History of Present Illness   Morgan Garza is a 63 year old female with lung cancer who presents with leg swelling and shortness of breath.  She has a history of adenocarcinoma of the lung, diagnosed on May 30th of the previous year. 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 She recently changed her oncologist to Dr. Helga at [facility] due to dissatisfaction with her previous care at [facility].  She reports new onset swelling in both legs over the past five days, with today being the smallest she has been. The swelling is bilateral and unusual for her. She also experiences shortness of breath when lying flat, requiring her to prop herself up with pillows. She recently returned from a trip to [facility], [place] , which involved a long flight, and she suspects this may have contributed to the swelling.  Her diabetes management has been challenging, with her most recent A1c at 9.5, up from 8.6 in September. She was previously on Rybelsus  but discontinued it due to choking episodes. She currently takes metformin , 3 tablets once daily, and glipizide , which she resumed on her own. She has not started Ozempic  due to insurance issues but is interested in more aggressive treatment for her diabetes.  She experiences audible lung sounds and has gained an extraordinary amount of weight recently. She has a lymph node near her  esophagus or larynx, which her doctors called some type of ulcer and said they were not going to do anything with. She has a dry cough that resolves with water and denies any recent COVID-like symptoms.  Her current medications include lisinopril , hydrochlorothiazide , amlodipine , atorvastatin , and topical diclofenac  for her shoulder and knees. She recently used Celebrex  for shoulder pain, prescribed for a short duration. She has an inhaler and nasal spray, which she forgot to take on her recent trip.   Pt is requesting a referral to psych due to continued anxiety regarding all of her current health issues. Prior to two years ago pt states she was healthy and on no medications thus she is dealing with major life changes.      Patient Active Problem List   Diagnosis Date Noted   Primary insomnia 12/10/2022   Stage III adenocarcinoma of lung (HCC) 09/24/2022   Memory deficit 09/09/2022   Primary osteoarthritis of right knee 06/09/2022   Encounter for smoking cessation counseling 06/09/2022   Anxiousness 06/09/2022   Physical exam 04/27/2022   Cervicalgia 04/06/2018   Closed head injury with concussion 04/06/2018   Fall (on) (from) other stairs and steps, initial encounter 04/06/2018   Cigarette nicotine  dependence without complication 02/06/2018   Sebaceous cyst of labia 02/06/2018   Mass of axilla, left 02/04/2018   Postmenopausal 02/02/2018   History of colon polyps    Allergic rhinitis 09/04/2015   BMI 39.0-39.9,adult 09/04/2015   Primary hypertension 09/04/2015   Hyperlipidemia 09/04/2015   Type 2 diabetes  mellitus without complication, without long-term current use of insulin (HCC) 04/26/2012   Right lumbar radiculopathy 04/22/2012   Past Medical History:  Diagnosis Date   Anemia    Arthritis    Diabetes mellitus without complication (HCC)    History of colon polyps    Hyperlipidemia    Hypertension    Lung cancer (HCC)    Obesity    Past Surgical History:  Procedure  Laterality Date   COLONOSCOPY  07/07/2012   [facility]   NO PAST SURGERIES     Social History   Tobacco Use   Smoking status: Former    Current packs/day: 0.50    Average packs/day: 0.5 packs/day for 1 year (0.5 ttl pk-yrs)    Types: Cigarettes   Smokeless tobacco: Never  Vaping Use   Vaping status: Never Used  Substance Use Topics   Alcohol use: Not Currently    Alcohol/week: 1.0 standard drink of alcohol    Types: 1 Standard drinks or equivalent per week    Comment: ocassionally   Drug use: Never      ROS: as noted in HPI  Objective:     BP 124/79 (BP Location: Left Arm, Patient Position: Sitting, Cuff Size: Large)   Pulse 66   Resp 18   Ht 5' 8 (1.727 m)   Wt 280 lb 4 oz (127.1 kg)   LMP 11/09/2010   SpO2 98%   BMI 42.61 kg/m  BP Readings from Last 3 Encounters:  09/16/23 124/79  06/21/23 (!) 141/79  04/07/23 126/75   Wt Readings from Last 3 Encounters:  09/16/23 280 lb 4 oz (127.1 kg)  06/21/23 268 lb (121.6 kg)  03/18/23 262 lb (118.8 kg)      Physical Exam Vitals and nursing note reviewed.  Constitutional:      General: She is not in acute distress.    Appearance: Normal appearance. She is not ill-appearing, toxic-appearing or diaphoretic.  HENT:     Head: Normocephalic and atraumatic.     Right Ear: External ear normal.     Left Ear: External ear normal.     Nose: Nose normal.     Mouth/Throat:     Mouth: Mucous membranes are moist.     Pharynx: Oropharynx is clear. No oropharyngeal exudate or posterior oropharyngeal erythema.   Eyes:     General: No scleral icterus.       Right eye: No discharge.        Left eye: No discharge.     Extraocular Movements: Extraocular movements intact.     Pupils: Pupils are equal, round, and reactive to light.   Neck:     Thyroid: No thyroid mass, thyromegaly or thyroid tenderness.   Cardiovascular:     Rate and Rhythm: Normal rate and regular rhythm.     Pulses: Normal pulses.      Heart sounds: No murmur heard. Pulmonary:     Effort: Pulmonary effort is normal. No respiratory distress.     Breath sounds: No stridor. Wheezing (expiratory wheezes all posterior lung fields) present. No rhonchi.  Abdominal:     General: Abdomen is flat. Bowel sounds are normal. There is no distension.     Palpations: Abdomen is soft. There is no mass.     Tenderness: There is no abdominal tenderness. There is no guarding.   Musculoskeletal:     Cervical back: Normal range of motion and neck supple. No rigidity or tenderness.     Right lower leg:  Edema present.     Left lower leg: Edema present.     Comments: Mild, equal bilateral non-pitting edema  Lymphadenopathy:     Cervical: No cervical adenopathy.   Skin:    General: Skin is warm and dry.     Coloration: Skin is not jaundiced.     Findings: No bruising, erythema or rash.   Neurological:     General: No focal deficit present.     Mental Status: She is alert and oriented to person, place, and time.     Sensory: No sensory deficit.     Motor: No weakness.   Psychiatric:        Mood and Affect: Mood normal.        Behavior: Behavior normal.      Results for orders placed or performed in visit on 09/16/23  POCT HgB A1C  Result Value Ref Range   Hemoglobin A1C 9.5 (A) 4.0 - 5.6 %   HbA1c POC (<> result, manual entry)     HbA1c, POC (prediabetic range)     HbA1c, POC (controlled diabetic range)      Last CBC Lab Results  Component Value Date   WBC 2.8 (L) 12/08/2022   HGB 12.2 12/08/2022   HCT 39.1 12/08/2022   MCV 87 12/08/2022   MCH 27.1 12/08/2022   RDW 16.5 (H) 12/08/2022   PLT 222 12/08/2022   Last metabolic panel Lab Results  Component Value Date   GLUCOSE 148 (H) 12/08/2022   NA 140 12/08/2022   K 4.0 12/08/2022   CL 101 12/08/2022   CO2 22 12/08/2022   BUN 25 12/08/2022   CREATININE 0.99 12/08/2022   EGFR 64 12/08/2022   CALCIUM  9.5 12/08/2022   PROT 7.1 12/08/2022   ALBUMIN 4.3 12/08/2022    LABGLOB 2.8 12/08/2022   BILITOT 0.3 12/08/2022   ALKPHOS 82 12/08/2022   AST 17 12/08/2022   ALT 14 12/08/2022   Last lipids Lab Results  Component Value Date   CHOL 177 12/08/2022   HDL 56 12/08/2022   LDLCALC 99 12/08/2022   TRIG 124 12/08/2022   CHOLHDL 3.2 12/08/2022   Last hemoglobin A1c Lab Results  Component Value Date   HGBA1C 9.5 (A) 09/16/2023   Last thyroid functions No results found for: TSH, T3TOTAL, T4TOTAL, THYROIDAB Last vitamin D No results found for: 25OHVITD2, 25OHVITD3, VD25OH Last vitamin B12 and Folate Lab Results  Component Value Date   VITAMINB12 587 09/09/2022      The 10-year ASCVD risk score (Arnett DK, et al., 2019) is: 15.3%  Assessment & Plan:  Type 2 diabetes mellitus without complication, without long-term current use of insulin (HCC) -     POCT glycosylated hemoglobin (Hb A1C) -     Ozempic  (0.25 or 0.5 MG/DOSE); Inject 0.25 mg subq qwk for 4 wk then inject 0.5 mg subq qwk x4 wk, then go to full dose pen qwk  Dispense: 3 mL; Refill: 1  Adenocarcinoma of left lung, stage 3 (HCC)  Primary hypertension  Other hyperlipidemia  Primary insomnia  Peripheral edema -     Brain natriuretic peptide  Shortness of breath -     Brain natriuretic peptide  Wheezing  Anxiousness -     Ambulatory referral to Psychiatry  Assessment and Plan    Lung Adenocarcinoma Recent CT scan completed yesterday at [facility] ordered by her oncologist. Symptoms include wheezing and dry cough, possibly due to travel and allergens. - Review CT scan results once available. Additional  CXR today not indicated as CT more sensitive - Use Ventolin inhaler as needed, two puffs every six hours. - Order BNP test to rule out cardiac-related fluid retention.  Type 2 Diabetes Mellitus A1c elevated at 9.5%. Rybelsus  discontinued due to choking. Aggressive management discussed to lower A1c to 6.5%. Ozempic  chosen for additional benefits. - Discontinue  Rybelsus . - Continue metformin  but increase to 2 tablets twice daily. - Continue glipizide . - Initiate Ozempic  starting at 0.25 mg weekly, titrate to 0.5 mg after one month. - Monitor blood glucose levels at home and report in 2-3 weeks.  Peripheral Edema Bilateral leg swelling possibly related to travel or amlodipine . No hx of CHF. Orthopnea present, however no crackles or rales appreciated on exam. - Order BNP test to rule out cardiac-related fluid retention. - Consider short-term diuretic therapy if swelling persists. Pt reports significant improvement since it developed and believes it may have been due to recent travel.   Hypertension Blood pressure well-controlled. Amlodipine  may contribute to edema. - Continue current antihypertensive regimen.  Hyperlipidemia Recently restarted atorvastatin . - Continue atorvastatin .         Return in about 6 weeks (around 10/28/2023).   Benton LITTIE Gave, PA

## 2023-09-20 LAB — BRAIN NATRIURETIC PEPTIDE: BNP: 40.5 pg/mL (ref 0.0–100.0)

## 2023-09-22 ENCOUNTER — Telehealth: Payer: Self-pay

## 2023-09-22 NOTE — Telephone Encounter (Signed)
 Pharmacy Patient Advocate Encounter   Received notification from CoverMyMeds that prior authorization for Ozempic  (0.25 or 0.5 MG/DOSE) 2MG /3ML pen-injectors is required/requested.   Insurance verification completed.   The patient is insured through [facility] .   Per test claim: PA required; PA submitted to above mentioned insurance via CoverMyMeds Key/confirmation #/EOC [id number] Status is pending

## 2023-09-23 ENCOUNTER — Other Ambulatory Visit (HOSPITAL_COMMUNITY): Payer: Self-pay

## 2023-09-23 ENCOUNTER — Encounter: Payer: Self-pay | Admitting: Urgent Care

## 2023-09-23 DIAGNOSIS — R062 Wheezing: Secondary | ICD-10-CM

## 2023-09-23 DIAGNOSIS — R051 Acute cough: Secondary | ICD-10-CM

## 2023-09-23 MED ORDER — ALBUTEROL SULFATE HFA 108 (90 BASE) MCG/ACT IN AERS: 1.0000 | INHALATION_SPRAY | Freq: Four times a day (QID) | RESPIRATORY_TRACT | 0 refills | Status: AC | PRN

## 2023-09-23 MED ORDER — BENZONATATE 100 MG PO CAPS: 200.0000 mg | ORAL_CAPSULE | Freq: Three times a day (TID) | ORAL | 0 refills | Status: AC | PRN

## 2023-09-23 NOTE — Telephone Encounter (Signed)
 Pharmacy Patient Advocate Encounter  Received notification from [facility] that Prior Authorization for Ozempic  (0.25 or 0.5 MG/DOSE) 2MG /3ML pen-injectors  has been APPROVED from 09/22/23 to 09/21/24. Ran test claim, Copay is $4. This test claim was processed through [facility]- copay amounts may vary at other pharmacies due to pharmacy/plan contracts, or as the patient moves through the different stages of their insurance plan.   PA #/Case ID/Reference #: [id number]

## 2023-11-15 ENCOUNTER — Other Ambulatory Visit: Payer: Self-pay

## 2023-11-15 MED ORDER — AMLODIPINE BESYLATE 10 MG PO TABS: 10.0000 mg | ORAL_TABLET | Freq: Every day | ORAL | 0 refills | Status: AC

## 2023-11-24 ENCOUNTER — Telehealth: Payer: Self-pay

## 2023-11-24 NOTE — Telephone Encounter (Signed)
 Patient was identified as falling into the [place] Measure - Diabetes.   Patient was: Left voicemail to schedule with primary care provider.

## 2023-11-30 ENCOUNTER — Encounter: Payer: Self-pay | Admitting: Sports Medicine

## 2023-12-05 ENCOUNTER — Other Ambulatory Visit: Payer: Self-pay | Admitting: Urgent Care

## 2023-12-05 DIAGNOSIS — E119 Type 2 diabetes mellitus without complications: Secondary | ICD-10-CM

## 2024-02-17 ENCOUNTER — Other Ambulatory Visit: Payer: Self-pay | Admitting: Urgent Care

## 2024-04-07 ENCOUNTER — Telehealth: Payer: Self-pay

## 2024-04-07 ENCOUNTER — Other Ambulatory Visit: Payer: Self-pay

## 2024-04-07 DIAGNOSIS — E119 Type 2 diabetes mellitus without complications: Secondary | ICD-10-CM

## 2024-04-07 MED ORDER — METFORMIN HCL ER 500 MG PO TB24: ORAL_TABLET | ORAL | 0 refills | Status: AC

## 2024-04-14 NOTE — Telephone Encounter (Signed)
 error

## 2024-06-20 ENCOUNTER — Ambulatory Visit: Admitting: Neurology
